# Patient Record
Sex: Male | Born: 1975 | Race: White | Hispanic: No | Marital: Married
Health system: Midwestern US, Community
[De-identification: ages and names within clinical notes are randomized; demographics above are authoritative.]

## PROBLEM LIST (undated history)

## (undated) DIAGNOSIS — IMO0002 Reserved for concepts with insufficient information to code with codable children: Secondary | ICD-10-CM

## (undated) DIAGNOSIS — I2699 Other pulmonary embolism without acute cor pulmonale: Secondary | ICD-10-CM

## (undated) DIAGNOSIS — Z131 Encounter for screening for diabetes mellitus: Secondary | ICD-10-CM

## (undated) DIAGNOSIS — I10 Essential (primary) hypertension: Principal | ICD-10-CM

## (undated) HISTORY — PX: APPENDECTOMY: SHX54

## (undated) HISTORY — PX: CERVICAL FUSION: SHX112

## (undated) HISTORY — PX: OTHER SURGICAL HISTORY: SHX169

---

## 1997-06-01 ENCOUNTER — Emergency Department (HOSPITAL_COMMUNITY): Admission: EM | Admit: 1997-06-01 | Discharge: 1997-06-01 | Payer: Self-pay | Admitting: *Deleted

## 2002-07-03 ENCOUNTER — Emergency Department (HOSPITAL_COMMUNITY): Admission: EM | Admit: 2002-07-03 | Discharge: 2002-07-03 | Payer: Self-pay

## 2002-07-03 ENCOUNTER — Encounter: Payer: Self-pay | Admitting: Hospitalist

## 2003-02-14 ENCOUNTER — Encounter: Admission: RE | Admit: 2003-02-14 | Discharge: 2003-02-14 | Payer: Self-pay | Admitting: Family Medicine

## 2003-10-21 ENCOUNTER — Encounter (INDEPENDENT_AMBULATORY_CARE_PROVIDER_SITE_OTHER): Payer: Self-pay | Admitting: Specialist

## 2003-10-21 ENCOUNTER — Inpatient Hospital Stay (HOSPITAL_COMMUNITY): Admission: EM | Admit: 2003-10-21 | Discharge: 2003-10-22 | Payer: Self-pay | Admitting: *Deleted

## 2004-02-02 ENCOUNTER — Ambulatory Visit: Payer: Self-pay | Admitting: Internal Medicine

## 2005-06-15 ENCOUNTER — Inpatient Hospital Stay (HOSPITAL_COMMUNITY): Admission: EM | Admit: 2005-06-15 | Discharge: 2005-06-17 | Payer: Self-pay | Admitting: Emergency Medicine

## 2006-03-17 ENCOUNTER — Ambulatory Visit (HOSPITAL_COMMUNITY): Admission: RE | Admit: 2006-03-17 | Discharge: 2006-03-17 | Payer: Self-pay | Admitting: Specialist

## 2006-03-24 ENCOUNTER — Ambulatory Visit: Payer: Self-pay | Admitting: Oncology

## 2006-04-07 LAB — CBC WITH DIFFERENTIAL/PLATELET
Basophils Absolute: 0.1 10*3/uL (ref 0.0–0.1)
EOS%: 5.4 % (ref 0.0–7.0)
HCT: 43.6 % (ref 38.7–49.9)
HGB: 15.3 g/dL (ref 13.0–17.1)
LYMPH%: 31.6 % (ref 14.0–48.0)
MCH: 30.6 pg (ref 28.0–33.4)
MCHC: 35.1 g/dL (ref 32.0–35.9)
MCV: 87.1 fL (ref 81.6–98.0)
MONO%: 7.8 % (ref 0.0–13.0)
NEUT%: 53.8 % (ref 40.0–75.0)
Platelets: 312 10*3/uL (ref 145–400)
lymph#: 2.8 10*3/uL (ref 0.9–3.3)

## 2006-04-07 LAB — MORPHOLOGY: PLT EST: ADEQUATE

## 2006-04-11 ENCOUNTER — Ambulatory Visit (HOSPITAL_COMMUNITY): Admission: RE | Admit: 2006-04-11 | Discharge: 2006-04-12 | Payer: Self-pay | Admitting: Neurosurgery

## 2006-04-12 LAB — COMPREHENSIVE METABOLIC PANEL
Albumin: 4.7 g/dL (ref 3.5–5.2)
BUN: 14 mg/dL (ref 6–23)
CO2: 22 mEq/L (ref 19–32)
Glucose, Bld: 115 mg/dL — ABNORMAL HIGH (ref 70–99)
Sodium: 134 mEq/L — ABNORMAL LOW (ref 135–145)
Total Bilirubin: 0.4 mg/dL (ref 0.3–1.2)
Total Protein: 7.7 g/dL (ref 6.0–8.3)

## 2006-04-12 LAB — LUPUS ANTICOAGULANT PANEL: PTT Lupus Anticoagulant: 45.4 secs (ref 36.3–48.8)

## 2006-04-12 LAB — BETA-2 GLYCOPROTEIN ANTIBODIES: Beta-2-Glycoprotein I IgM: <4 U/mL (ref <10)

## 2006-04-12 LAB — PROTHROMBIN GENE MUTATION

## 2006-04-12 LAB — CARDIOLIPIN ANTIBODIES, IGG, IGM, IGA: Anticardiolipin IgG: <7 [GPL'U] (ref <11)

## 2006-04-12 LAB — FACTOR 5 LEIDEN

## 2006-04-12 LAB — LACTATE DEHYDROGENASE: LDH: 142 U/L (ref 94–250)

## 2006-05-04 ENCOUNTER — Ambulatory Visit: Payer: Self-pay | Admitting: Oncology

## 2006-10-12 ENCOUNTER — Encounter: Admission: RE | Admit: 2006-10-12 | Discharge: 2006-10-12 | Payer: Self-pay | Admitting: Neurosurgery

## 2007-05-31 ENCOUNTER — Ambulatory Visit: Payer: Self-pay | Admitting: Family Medicine

## 2007-11-01 ENCOUNTER — Encounter: Admission: RE | Admit: 2007-11-01 | Discharge: 2007-11-01 | Payer: Self-pay | Admitting: Neurosurgery

## 2007-11-29 ENCOUNTER — Encounter: Admission: RE | Admit: 2007-11-29 | Discharge: 2007-11-29 | Payer: Self-pay | Admitting: Neurosurgery

## 2008-06-03 ENCOUNTER — Ambulatory Visit: Payer: Self-pay | Admitting: Family Medicine

## 2008-06-03 LAB — CONVERTED CEMR LAB
Bilirubin Urine: NEGATIVE
Blood in Urine, dipstick: NEGATIVE
Epithelial cells, urine: 0 /lpf
Glucose, Urine, Semiquant: NEGATIVE
Ketones, urine, test strip: NEGATIVE
Protein, U semiquant: NEGATIVE
RBC / HPF: 0
Specific Gravity, Urine: 1.015
WBC, UA: 0 cells/hpf
pH: 7.5

## 2008-09-25 ENCOUNTER — Encounter: Admission: RE | Admit: 2008-09-25 | Discharge: 2008-09-25 | Payer: Self-pay | Admitting: Neurosurgery

## 2009-01-07 ENCOUNTER — Ambulatory Visit: Payer: Self-pay | Admitting: Family Medicine

## 2009-01-07 LAB — CONVERTED CEMR LAB: Rapid Strep: POSITIVE

## 2009-03-10 ENCOUNTER — Encounter: Admission: RE | Admit: 2009-03-10 | Discharge: 2009-03-10 | Payer: Self-pay | Admitting: Neurosurgery

## 2009-08-28 ENCOUNTER — Encounter: Admission: RE | Admit: 2009-08-28 | Discharge: 2009-08-28 | Payer: Self-pay | Admitting: Neurosurgery

## 2009-12-07 ENCOUNTER — Ambulatory Visit: Payer: Self-pay | Admitting: Internal Medicine

## 2010-01-13 ENCOUNTER — Ambulatory Visit: Payer: Self-pay | Admitting: Internal Medicine

## 2010-01-13 ENCOUNTER — Encounter: Payer: Self-pay | Admitting: Internal Medicine

## 2010-01-13 DIAGNOSIS — F17200 Nicotine dependence, unspecified, uncomplicated: Secondary | ICD-10-CM | POA: Insufficient documentation

## 2010-01-13 DIAGNOSIS — D179 Benign lipomatous neoplasm, unspecified: Secondary | ICD-10-CM | POA: Insufficient documentation

## 2010-01-13 DIAGNOSIS — M51379 Other intervertebral disc degeneration, lumbosacral region without mention of lumbar back pain or lower extremity pain: Secondary | ICD-10-CM | POA: Insufficient documentation

## 2010-01-13 DIAGNOSIS — M5412 Radiculopathy, cervical region: Secondary | ICD-10-CM | POA: Insufficient documentation

## 2010-01-13 DIAGNOSIS — J069 Acute upper respiratory infection, unspecified: Secondary | ICD-10-CM | POA: Insufficient documentation

## 2010-01-13 DIAGNOSIS — M5137 Other intervertebral disc degeneration, lumbosacral region: Secondary | ICD-10-CM

## 2010-01-15 LAB — CONVERTED CEMR LAB
ALT: 23 units/L (ref 0–53)
AST: 21 units/L (ref 0–37)
Albumin: 4.8 g/dL (ref 3.5–5.2)
Alkaline Phosphatase: 40 units/L (ref 39–117)
BUN: 10 mg/dL (ref 6–23)
Basophils Absolute: 0 10*3/uL (ref 0.0–0.1)
Basophils Relative: 0.4 % (ref 0.0–3.0)
Bilirubin, Direct: 0.1 mg/dL (ref 0.0–0.3)
CO2: 29 meq/L (ref 19–32)
Calcium: 9.9 mg/dL (ref 8.4–10.5)
Chloride: 102 meq/L (ref 96–112)
Cholesterol: 239 mg/dL — ABNORMAL HIGH (ref 0–200)
Creatinine, Ser: 0.9 mg/dL (ref 0.4–1.5)
Direct LDL: 164.2 mg/dL
Eosinophils Absolute: 0.5 10*3/uL (ref 0.0–0.7)
Eosinophils Relative: 4.3 % (ref 0.0–5.0)
GFR calc non Af Amer: 107.31 mL/min (ref >60.00)
Glucose, Bld: 81 mg/dL (ref 70–99)
HCT: 44.2 % (ref 39.0–52.0)
HDL: 35.4 mg/dL — ABNORMAL LOW (ref >39.00)
Hemoglobin: 15 g/dL (ref 13.0–17.0)
Lymphocytes Relative: 22.9 % (ref 12.0–46.0)
Lymphs Abs: 2.7 10*3/uL (ref 0.7–4.0)
MCHC: 33.9 g/dL (ref 30.0–36.0)
MCV: 91.3 fL (ref 78.0–100.0)
Monocytes Absolute: 0.8 10*3/uL (ref 0.1–1.0)
Monocytes Relative: 7.1 % (ref 3.0–12.0)
Neutro Abs: 7.7 10*3/uL (ref 1.4–7.7)
Neutrophils Relative %: 65.3 % (ref 43.0–77.0)
PSA: 0.53 ng/mL (ref 0.10–4.00)
Platelets: 290 10*3/uL (ref 150.0–400.0)
Potassium: 5.2 meq/L — ABNORMAL HIGH (ref 3.5–5.1)
RBC: 4.83 M/uL (ref 4.22–5.81)
RDW: 12.6 % (ref 11.5–14.6)
Sodium: 139 meq/L (ref 135–145)
TSH: 0.67 microintl units/mL (ref 0.35–5.50)
Total Bilirubin: 0.9 mg/dL (ref 0.3–1.2)
Total CHOL/HDL Ratio: 7
Total Protein: 7.8 g/dL (ref 6.0–8.3)
Triglycerides: 256 mg/dL — ABNORMAL HIGH (ref 0.0–149.0)
VLDL: 51.2 mg/dL — ABNORMAL HIGH (ref 0.0–40.0)
WBC: 11.8 10*3/uL — ABNORMAL HIGH (ref 4.5–10.5)

## 2010-02-13 ENCOUNTER — Encounter: Payer: Self-pay | Admitting: Family Medicine

## 2010-02-23 NOTE — Assessment & Plan Note (Signed)
Summary: STREP THROAT/DLO   Vital Signs:  Patient profile:   35 year old male Height:      77 inches Weight:      235 pounds BMI:     27.97 Temp:     97.8 degrees F oral Pulse rate:   88 / minute Pulse rhythm:   regular BP sitting:   120 / 84  (left arm) Cuff size:   large  Vitals Entered By: Selena Batten Dance CMA Duncan Dull) (December 07, 2009 2:14 PM) CC: ? Strep Comments Sore throat x3 days, no energy, no appetite   History of Present Illness: CC: ST  4d h/o ST, body aches, fatigue.  tried gargling salt water, lemon juice, chloraseptic spray.  Also with fever to 101 last night.  + chills.  + HA this am.    No cough, abd pain, n/v, rashes.  No sick contacts at home.  + daughters sick 1 1/2 wks ago with AOM.  + had flu shot.  + pt and wife smoke, 3-4/day.    Current Medications (verified): 1)  Oxycodone-Acetaminophen 10-325 Mg Tabs (Oxycodone-Acetaminophen) .... As Needed  Allergies: 1)  ! Codeine PMH-FH-SH reviewed for relevance  Social History: mild smoker (3-4 cig/day)  Review of Systems       per HPI  Physical Exam  General:  alert, well-developed, well-nourished, and well-hydrated.   Head:  Normocephalic and atraumatic without obvious abnormalities. No apparent alopecia or balding. Eyes:  No corneal or conjunctival inflammation noted. EOMI. Perrla.  Ears:  R ear normal and L ear normal.   Nose:  no airflow obstruction, mucosal erythema, and mucosal edema.  Mouth:  no exudates, + pharyngeal erythema Neck:  + R AC LAD Lungs:  Normal respiratory effort, chest expands symmetrically. Lungs are clear to auscultation, no crackles or wheezes. Heart:  normal rate, regular rhythm, and no murmur.   Abdomen:  abdomen soft and non-tender without masses, organomegaly or hernias noted. Pulses:  2+ rad pulses Extremities:  no edema Skin:  turgor normal, color normal, and no rashes.     Impression & Recommendations:  Problem # 1:  PHARYNGITIS, STREPTOCOCCAL, ACUTE  (ICD-034.0)  rapid strep positive.  take abx for 10 days.  red flags to return discussed  The following medications were removed from the medication list:    Amoxicillin 500 Mg Caps (Amoxicillin) .Marland Kitchen... Take 2 caps two times a day x 10days His updated medication list for this problem includes:    Amoxicillin 875 Mg Tabs (Amoxicillin) .Marland Kitchen... Take one twice daily x 10 days    Ibuprofen 600 Mg Tabs (Ibuprofen) .Marland Kitchen... Take one by mouth three times a day as needed sore throat with food  Orders: Rapid Strep (13086)  Complete Medication List: 1)  Oxycodone-acetaminophen 10-325 Mg Tabs (Oxycodone-acetaminophen) .... As needed 2)  Amoxicillin 875 Mg Tabs (Amoxicillin) .... Take one twice daily x 10 days 3)  Ibuprofen 600 Mg Tabs (Ibuprofen) .... Take one by mouth three times a day as needed sore throat with food  Patient Instructions: 1)  Looks like strep throat. 2)  Wash hands to prevent spreading. 3)  Push fluids, get plenty of rest, ibuprofen (motrin) for sore throat.  Suck on cold things like popsicles, or heat to soothe throat like herbal teas, salt water gargles. 4)  If fever >101.5, trouble breathing or opening mouth, drooling, or other concerns, you may need to return to be seen. 5)  Pleasure to see you today, call clinic with questions.  Prescriptions: IBUPROFEN 600 MG  TABS (IBUPROFEN) take one by mouth three times a day as needed sore throat with food  #30 x 0   Entered and Authorized by:   Eustaquio Boyden  MD   Signed by:   Eustaquio Boyden  MD on 12/07/2009   Method used:   [facility]        [facility]ret[facility]       [address]       [place], [place]  [address]       Ph: [phone] or [phone]       Fax: [phone]   RxID:   [id number] AMOXICILLIN 875 MG TABS (AMOXICILLIN) take one twice daily x 10 days  #20 x 0   Entered and Authorized by:   Eustaquio Boyden  MD   Signed by:   Eustaquio Boyden  MD on 12/07/2009   Method used:    [facility]        [facility] [id number]       [address]       [place], [place]  [address]       Ph: [phone] or [phone]       Fax: [phone]   RxID:   [id number]    Orders Added: 1)  Est. Patient Level III [07371] 2)  Rapid Strep [06269]    Prior Medications: Current Allergies (reviewed today): ! CODEINE  Laboratory Results  Date/Time Received: December 07, 2009  Date/Time Reported: December 07, 2009   Other Tests  Rapid Strep: positive

## 2010-02-25 NOTE — Assessment & Plan Note (Signed)
Summary: NEW TO ESTAB//PH   Vital Signs:  Patient profile:   35 year old male Height:      77.5 inches Weight:      237.2 pounds BMI:     27.87 Temp:     98.5 degrees F oral Pulse rate:   72 / minute Resp:     14 per minute BP sitting:   128 / 86  (left arm) Cuff size:   large  Vitals Entered By: Shonna Chock CMA (January 13, 2010 11:24 AM) CC: New establish: CPX and examine knot on back ( x 1 year and when touched causes pain in right arm and neck area), URI symptoms   Primary Care Provider:  Kerby Nora MD  CC:  New establish: CPX and examine knot on back ( x 1 year and when touched causes pain in right arm and neck area) and URI symptoms.  History of Present Illness:    [name] is here for a physical; he has had minor URI symptoms for 3 days. The patient reports nasal congestion and purulent ( ? green) nasal discharge, but denies productive cough and earache.  The patient denies fever, dyspnea, and wheezing.  The patient denies headache.  The patient denies the following risk factors for Strep sinusitis: unilateral facial pain, tooth pain, and tender adenopathy.  Rx: Mucinex.  Preventive Screening-Counseling & Management  Caffeine-Diet-Exercise     Does Patient Exercise: no  Current Medications (verified): 1)  Oxycodone-Acetaminophen 10-325 Mg Tabs (Oxycodone-Acetaminophen) .... As Needed  Allergies: 1)  ! 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  The patient denies anorexia, weight loss, weight gain, vision loss, decreased hearing, hoarseness, chest pain, syncope, peripheral edema, prolonged cough, headaches, hemoptysis, abdominal pain, melena, hematochezia, severe indigestion/heartburn, suspicious skin lesions, depression, unusual weight change, abnormal bleeding, enlarged lymph nodes, and angioedema.          Right C-8 radiculopathy symptoms when Lipoma R upper back is palpated X 3  days .Tremor RUE with lifting motion  X 6 months. GU:  Denies discharge, dysuria, and hematuria.  Physical Exam  General:  well-nourished,in no acute distress; alert,appropriate and cooperative throughout examination Head:  Normocephalic and atraumatic without obvious abnormalities. No apparent alopecia . Eyes:  No corneal or conjunctival inflammation noted.Perrla. Funduscopic exam benign, without hemorrhages, exudates or papilledema.  Ears:  External ear exam shows no significant lesions or deformities.  Otoscopic examination reveals clear canals, tympanic membranes are intact bilaterally without bulging, retraction, inflammation or discharge. Hearing is grossly normal bilaterally. Nose:  External nasal examination shows no deformity or inflammation. Nasal mucosa  dry without lesions or exudates.Septal deviation to R Mouth:  Oral mucosa and oropharynx without lesions or exudates.  Teeth in good repair. Mid pharyngeal erythema.   Neck:  No deformities, masses, or tenderness noted.Thyroid full w/o nodules  Lungs:  Normal respiratory effort, chest expands symmetrically. Lungs are clear to auscultation, no crackles or  wheezes. Heart:  Normal rate and regular rhythm. S1 and S2 normal without gallop, murmur, click, rub.S4 Abdomen:  Bowel sounds positive,abdomen soft and non-tender without masses, organomegaly or hernias noted. Rectal:  No external abnormalities noted. Normal sphincter  tone. No rectal masses or tenderness. Genitalia:  Testes bilaterally descended without nodularity, tenderness or masses. No scrotal masses or lesions. No penis lesions or urethral discharge. L varicocele. Tiny granuloma R testicle  Prostate:  Prostate gland firm and smooth, no enlargement, nodularity, tenderness, mass, asymmetry or induration. Msk:  Pain LS area with SLR   15   degrees Pulses:  R and L carotid,radial,dorsalis pedis and posterior tibial pulses are full and equal bilaterally Extremities:  No clubbing, cyanosis, edema.  Neurologic:  alert & oriented X3, strength normal in all extremities, and DTRs symmetrical and normal.   ? weakness of thumb to 5th digits bilaterally Skin:  Lipoma R upper back ; C 8 pain radiation with palpation Cervical Nodes:  No lymphadenopathy noted Axillary Nodes:  No palpable lymphadenopathy Inguinal Nodes:  No significant adenopathy Psych:  memory intact for recent and remote, normally interactive, and good eye contact.     Impression & Recommendations:  Problem # 1:  ROUTINE GENERAL MEDICAL EXAM@HEALTH  CARE FACL (ICD-V70.0)  Orders: EKG w/ Interpretation (93000) Venipuncture (47425) TLB-Lipid Panel (80061-LIPID) TLB-BMP (Basic Metabolic Panel-BMET) (80048-METABOL) TLB-CBC Platelet - w/Differential (85025-CBCD) TLB-Hepatic/Liver Function Pnl (80076-HEPATIC) TLB-TSH (Thyroid Stimulating Hormone) (84443-TSH) TLB-PSA (Prostate Specific Antigen) (84153-PSA)  Problem # 2:  URI (ICD-465.9)  The following medications were removed from the medication list:    Ibuprofen 600 Mg Tabs (Ibuprofen) .Marland Kitchen... 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 As needed 2)  Amoxicillin 500 Mg Caps (Amoxicillin) .Marland Kitchen.. 1 three times a day  Patient Instructions: 1)  Consider exercises involving stretching as discussed. See Dr Channing Mutters if radicular RUE pain progresses. Prescriptions: AMOXICILLIN 500 MG CAPS (AMOXICILLIN) 1 three times a day  #30 x 0   Entered and Authorized by:   Marga Melnick MD   Signed by:   Marga Melnick MD on 01/13/2010   Method used:   [facility]ed to ...       [id number]       [address]       [place], [place]  [address]       Ph: [phone] or [phone]       Fax: [phone]   RxID:   [id number]    Orders Added: 1)  New Patient 18-39 years [99385] 2)  EKG w/ Interpretation [93000] 3)  Venipuncture [36415] 4)  TLB-Lipid Panel [80061-LIPID] 5)  TLB-BMP (Basic Metabolic Panel-BMET) [80048-METABOL] 6)  TLB-CBC Platelet - w/Differential [85025-CBCD] 7)  TLB-Hepatic/Liver Function Pnl [80076-HEPATIC] 8)  TLB-TSH (Thyroid Stimulating Hormone) [84443-TSH] 9)  TLB-PSA (Prostate Specific Antigen) [15176-HYW]

## 2010-05-05 ENCOUNTER — Other Ambulatory Visit: Payer: Self-pay | Admitting: Neurosurgery

## 2010-05-05 DIAGNOSIS — M4716 Other spondylosis with myelopathy, lumbar region: Secondary | ICD-10-CM

## 2010-05-25 ENCOUNTER — Ambulatory Visit
Admission: RE | Admit: 2010-05-25 | Discharge: 2010-05-25 | Disposition: A | Discharge status: Home / Self Care | Payer: PRIVATE HEALTH INSURANCE | Source: Ambulatory Visit | Attending: Neurosurgery | Admitting: Neurosurgery

## 2010-05-25 DIAGNOSIS — M4716 Other spondylosis with myelopathy, lumbar region: Secondary | ICD-10-CM

## 2010-06-11 NOTE — Op Note (Signed)
NAME:  Shawn [name], Shawn [name]               ACCOUNT NO.:  [id number]   MEDICAL RECORD NO.:  [id number]          PATIENT TYPE:  INP   LOCATION:  2550                         FACILITY:  [facility]   PHYSICIAN:  Gabrielle Dare. [name], M.D.DATE OF BIRTH:  1975-05-22   DATE OF PROCEDURE:  10/21/2003  DATE OF DISCHARGE:                                 OPERATIVE REPORT   PREOPERATIVE DIAGNOSIS:  Acute appendicitis.   POSTOPERATIVE DIAGNOSIS:  Acute appendicitis.   PROCEDURE:  Laparoscopic appendectomy.   SURGEON:  Gabrielle Dare. [name], M.D.   ANESTHESIA:  General.   HISTORY OF PRESENT ILLNESS:  The patient is a 35 year old male who presented  to the emergency department with abdominal pain.  History, physical, and  workup was consistent with acute appendicitis, and he is brought to the  operating room for emergency appendectomy.   PROCEDURE IN DETAIL:  Informed consent was obtained.  The patient received  intravenous antibiotics preoperatively in the emergency room.  He was  brought to the operating room, general anesthesia was administered, his  abdomen was prepped and draped in a sterile fashion.  An infraumbilical  incision was made, subcutaneous tissues were dissected down revealing the  anterior fascia, which was divided sharply.  The peritoneal cavity was then  entered under direct vision without difficulty.  A 0 Vicryl pursestring  suture was placed around the fascial opening.  The Hasson trocar was  inserted into the abdomen and the abdomen was insufflated with carbon  dioxide in the standard fashion.  Under direct vision a 12 mm left lower  quadrant port and a 5 mm right upper quadrant port were placed.  Exploration  revealed a long, inflamed appendix with a normal base.  The base was  dissected from the mesoappendix bluntly and the base of the appendix was  then divided with an endoscopic GIA stapler with vascular load.  Subsequently the mesoappendix was divided with two firings of the  endoscopic  GIA stapler with the vascular load.  Excellent hemostasis was obtained.  The  appendix was placed in an EndoCatch bag and removed via the left lower  quadrant port site.  Subsequently the abdomen was copiously irrigated.  The  staple lines were rechecked and hemostasis was noted.  Two liters of  irrigation were used.  The appendix was not perforated.  The irrigant  returned clear.  The staple lines were reinspected and excellent hemostasis  was noted.  At this time the ports were removed under direct vision and the  pneumoperitoneum was released.  The Hasson trocar was removed and the  infraumbilical fascial defect was closed by tying the pursestring Vicryl  suture.  The wounds were all copiously irrigated.  Marcaine 0.25% with  epinephrine had been injected after insertion and some further local was  injected at this time.  After  irrigating the wounds, the skin of each was closed with running 4-0 Vicryl  subcuticular stitch.  Sponge, needle, and instrument counts were correct.  Benzoin and Steri-Strips and sterile dressings were applied.  The patient  tolerated the procedure without apparent complications, was taken to the  recovery room in stable condition.       BET/MEDQ  D:  10/21/2003  T:  10/21/2003  Job:  [id number]

## 2010-06-11 NOTE — Discharge Summary (Signed)
Shawn [name], [name]               ACCOUNT NO.:  [id number]   MEDICAL RECORD NO.:  [id number]          PATIENT TYPE:  INP   LOCATION:  3731                         FACILITY:  [facility]   PHYSICIAN:  Hollice Espy, M.D.DATE OF BIRTH:  November 01, 1975   DATE OF ADMISSION:  06/15/2005  DATE OF DISCHARGE:  06/17/2005                                 DISCHARGE SUMMARY   PRIMARY CARE PHYSICIAN:  Stacie Acres. White, M.D.   DISCHARGE DIAGNOSES:  1.  Bilateral pulmonary emboli.  2.  Recent right shoulder arthroscopy.   DISCHARGE MEDICATIONS:  1.  Lovenox 100 mg subcu q.12h. x7 days.  2.  Coumadin 5 mg p.o. nightly.  Lovenox will be continued until the      patient's Coumadin is therapeutic.  3.  Percocet 5/325 p.o. q.6h. p.r.n., #30.  4.  Colace 100 mg p.o. b.i.d.  5.  Laxative p.r.n.   HOSPITAL COURSE:  The patient is a 35 year old, white male with past medical  history of recent right shoulder arthroscopy who for the past 5 days prior  to admission has been fatigued secondary to the arthroscopy and has been  mostly laying on his cough with minimal activity.  On the day of admission,  he started having problems with severe chest pain and shortness of breath  and came into the emergency room.  A CT scan of the chest was checked and he  was found to have bilateral pulmonary emboli.  The patient was admitted and  started on IV heparin and Coumadin.  By hospital day #2, the patient was  starting to feel a little bit better.  He still had some problems with pain,  but his breathing had significantly improved.  We discussed his course given  his otherwise general good health and the plans for long-term  anticoagulation.  We discussed the possibility of him going home on Lovenox  as well as dosing Coumadin with home health checking daily PT/INR and  calling these labs in.  He was interested in this option and this was set up  for May 24.  The patient will be paying a $50 co-pay for 7 days worth of  Lovenox.  The patient himself has been doing well.  He watched the Coumadin  instructional videos and we discussed extensively the risk of contact sports  and injury that could lead to falls and bleeding which he understood.  His  wife and mother were present for separate conservations and he they all show  complete understanding of this.   DISPOSITION:  The plan will be for the patient to go home on Jun 17, 2005,  with Lovenox 100 mg subcu b.i.d.  He has already received instructions and  teaching on how to self-administer the Lovenox or to have his family  administer it.  He has already received Coumadin with 10 mg on the night of  May 23, and May 24, and will now be on 5 mg p.o. nightly.  His INR is still  at baseline at 1.  The plan will be for the patient to have his daily PT/INR  checked.  Over the [facility] Day weekend, they will check this lab and this  will be called in to me, Dr. Hollice Espy.  The results following  completion of Memorial Day weekend of the PT/INRs, will be called into Dr.  Laurann Montana, his PCP.  The patient has since moved from [place] to  [place] and is interested in changing his PCP.  However, until this  established at the full follow-up appointment, PT/INR and appointments will  be made with Dr. Laurann Montana.  In the meantime, I have provided the  patient with my cell phone number as well as the answering phone number for  any problems or issues.   CONDITION ON DISCHARGE:  Improved.   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   DIET:  Regular, except to minimize leafy green vegetables which can affect  his Coumadin levels.      Hollice Espy, M.D.  Electronically Signed     SKK/MEDQ  D:  06/17/2005  T:  06/18/2005  Job:  [id number]   cc:   Stacie Acres. Cliffton Asters, M.D.  Fax: [phone]

## 2010-06-11 NOTE — H&P (Signed)
NAME:  Shawn [name], OVERBY               ACCOUNT NO.:  [id number]   MEDICAL RECORD NO.:  [id number]          PATIENT TYPE:  AMB   LOCATION:  SDS                          FACILITY:  [facility]   PHYSICIAN:  Payton Doughty, M.D.      DATE OF BIRTH:  07/08/1975   DATE OF ADMISSION:  04/11/2006  DATE OF DISCHARGE:                              HISTORY & PHYSICAL   ADMITTED TO [facility]   ADMISSION DIAGNOSIS:  Herniated disk C5-6.   HISTORY OF PRESENT ILLNESS:  A 35 year old right-handed white gentleman  had some softball injuries and then pain and numbness in his right  shoulder and arm.  An MRI demonstrated a disk at C5-6.  He presents now  today for an anterior decompression and fusion.   PAST MEDICAL HISTORY:  Remarkable for a DVT that he had after his  shoulder operation.  He said he laid around for about a week and half.  He was on Coumadin, he is now off since December and has no  difficulties.   FAMILY HISTORY:  He has no family history of coagulopathy, nor has he  ever had other difficulties.   PAST SURGICAL HISTORY:  1. Appendectomy in 2005.  2. Labral repair of his shoulder in 2007.   ALLERGIES:  HE GETS A RASH WITH CODEINE.   MEDICATIONS:  He is currently using hydrocodone and Lyrica.   FAMILY HISTORY:  Mom is 9, dad is 33 with prostate cancer.   SOCIAL HISTORY:  He does not smoke or drink.  He is a Medical illustrator.  He  talks on the phone a fair amount.  He does credit checks.   REVIEW OF SYSTEMS:  Remarkable for arm weakness, arm pain, neck pain.   PHYSICAL EXAMINATION:  HEENT:  Within normal limits.  NECK:  He has reasonable range of motion of the neck, but reproduces his  arm pain with turning his head.  CHEST:  Clear.  CARDIAC EXAM:  Regular rate and rhythm.  ABDOMEN:  Tender, no hepatosplenomegaly.  EXTREMITIES:  No clubbing or cyanosis.  GU:  Exam is deferred.  Peripheral pulses are good.  NEUROLOGICALLY:  He is awake, alert, and oriented.  Cranial nerves II-  XII intact.  MOTOR EXAM:  Extremities 5/5 strength to the left upper extremity.  Right upper extremity has definite weakness in the deltoid, biceps, and  triceps and a right C6 sensory deficit.  Reflexes are 1 to the right  biceps, absent to the left, 1 at the left triceps, 1 to the right.  Branchial radialis is 1 bilaterally.  Hoffman's is negative.   IMAGING:  An MRI that shows mild to moderate change at 3-4, but it is at  C5-6.  He is sensitive to the right side with observation on the right  side of the cord and the right C6 nerve root.   CLINICAL IMPRESSION:  1. Right C6 and C7 radiculopathy.  2. 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   PLAN:  For an anterior decompression effusion at C5-6.  The risks and  benefits of this approach had been discussed and he wishes  to proceed.           ______________________________  Payton Doughty, M.D.     MWR/MEDQ  D:  04/11/2006  T:  04/11/2006  Job:  [id number]

## 2010-06-11 NOTE — H&P (Signed)
NAME:  Shawn [name], Shawn [name]               ACCOUNT NO.:  [id number]   MEDICAL RECORD NO.:  [id number]          PATIENT TYPE:  EMS   LOCATION:  MAJO                         FACILITY:  [facility]   PHYSICIAN:  Gabrielle Dare. [name], M.D.DATE OF BIRTH:  Sep 13, 1975   DATE OF ADMISSION:  10/21/2003  DATE OF DISCHARGE:                                HISTORY & PHYSICAL   CHIEF COMPLAINT:  Right lower quadrant abdominal pain.   HISTORY OF PRESENT ILLNESS:  The patient is a 35 year old white male who  complains of abdominal pain which developed while was at work yesterday.  It  was initially generalized and came on around noon time.  Later in the  evening, the pain became more severe and gradually localized in his right  lower quadrant.  Overnight, he came to the emergency room for evaluation.  Workup included white blood cell count which was 15.1.  CT scan of abdomen  and pelvis this morning is consistent with acute appendicitis.  The patient  has no other current complaints.   PAST MEDICAL HISTORY:  Negative.   PAST SURGICAL HISTORY:  Negative.   ALLERGIES:  CODEINE.   SOCIAL HISTORY:  The patient smoke occasionally.  He does not drink alcohol.  He has history of some cocaine abuse in the past.   REVIEW OF SYSTEMS:  GENERAL:  Negative.  CARDIAC:  Negative.  PULMONARY:  Negative.  GI:  See History of Present Illness.  GU:  Negative.  MUSCULOSKELETAL:  Negative.   PHYSICAL EXAMINATION:  VITAL SIGNS:  Temperature 98.5, blood pressure  133/70, heart rate 83, respirations 18.  GENERAL:  He is awake, alert, and in no acute distress.  HEENT:  Pupils equal and reactive.  Sclerae nonicteric.  NECK:  Supple with no thyroid masses.  There is no supraclavicular or  cervical adenopathy.  LUNGS:  Clear to auscultation with normal respiratory excursion.  HEART:  Regular rate and rhythm.  PMI is palpable in the left chest.  ABDOMEN:  Soft.  He has tenderness in the right lower quadrant with  voluntary guarding.   No other tenderness or masses aer noted.  No  organomegaly is palpable.  SKIN:  Warm and dry with no rashes.  EXTREMITIES:  No significant edema.   DATA REVIEWED:  White blood cell count 15.1, hemoglobin 15.6, platelets 285.  Urinalysis is negative.  A metabolic profile is unremarkable.   IMPRESSION:  The patient is a 35 year old white male, who is otherwise  healthy, with acute appendicitis.   PLAN:  Give him intravenous antibiotics.  He has already received Unasyn by  the emergency department physician, and we will take him to the operating  room for laparoscopic appendectomy.  The procedure, risks, and benefits were  discussed in detail with the patient and his mother, and he is agreeable  with proceeding.  We will do this as soon as possible.       BET/MEDQ  D:  10/21/2003  T:  10/21/2003  Job:  [id number]

## 2010-06-11 NOTE — Op Note (Signed)
NAME:  Shawn [name], Shawn [name] NO.:  [id number]   MEDICAL RECORD NO.:  [id number]          PATIENT TYPE:  OIB   LOCATION:  3172                         FACILITY:  [facility]   PHYSICIAN:  Payton Doughty, M.D.      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   OPERATIVE PROCEDURE:  C5-C6 anterior cervical decompression and fusion  with Reflex hybrid plate.   ANESTHESIA:  General endotracheal anesthesia.   PREPARATION:  Betadine prep and alcohol wipe.   COMPLICATIONS:  None.   SURGEON:  Payton Doughty, M.D.   ASSISTANT:  Clydene Fake, M.D.  Nurse assistant Basilia Jumbo.   BODY OF TEXT:  This is a 35 year old right handed white gentleman with a  right C6 radiculopathy.  He was taken to the operating room, smoothly  anesthetized, and intubated.  He was placed supine on the operating  table in the halter head traction.  Following shave, prep, and drape in  the usual sterile fashion, the skin was incised in the midline at the  medial border of the sternocleidomastoid muscle on the left side.  The  platysma was identified, elevated, divided and undermined.  The  sternocleidomastoid was identified and medial dissection revealed the  carotid artery which was retracted laterally to the left, the trachea  and esophagus retracted laterally to the right, exposing the bones of  the anterior cervical spine.  A marker was placed and intraoperative x-  ray obtained to confirm correctness of the level.  Having confirmed the  correctness of the level, discectomy was carried out at C5-C6 under  gross observation.  We then brought the operating microscope in and used  microdissection technique to remove the remaining disc, divide the  posterior annular fibers, divide the posterior  longitudinal ligament,  and carefully explore the neural foramina bilaterally.  On the right  side, there was a large free fragment of disc extending out into the  foramen.  It was grasped and removed without difficulty.  The neural  foramen was carefully explored and found to be open.  The left side was  unencumbered.  The wound was irrigated, hemostasis was assured and an 8  mm bone graft was fashioned from a patellar allograft and tapped into  place.  Reflex hybrid plate was then placed, 14 mm long with 12 mm  screws, two in C5 and two in C6.  Intraoperative x-ray showed good  placement of bone graft, plate and screws.  The wound was, once again,  irrigated, hemostasis assured.  The esophagus was inspected and found to  be free of lesions.  The platysma and subcutaneous tissues were  approximated with 3-0 Vicryl in an interrupted  fashion.  The skin was closed with 4-0 Vicryl in a running subcuticular  fashion.  Benzoin and Steri-Strips were placed and made occlusive with  Telfa and OpSite.  The patient was placed  in an Aspen collar and  returned to the recovery room in good condition.           ______________________________  Payton Doughty, M.D.     MWR/MEDQ  D:  04/11/2006  T:  04/11/2006  Job:  [id number]

## 2010-06-11 NOTE — Discharge Summary (Signed)
NAME:  Shawn [name], Shawn [name]               ACCOUNT NO.:  [id number]   MEDICAL RECORD NO.:  [id number]          PATIENT TYPE:  INP   LOCATION:  5707                         FACILITY:  [facility]   PHYSICIAN:  Gabrielle Dare. [name], M.D.DATE OF BIRTH:  Dec 29, 1975   DATE OF ADMISSION:  10/21/2003  DATE OF DISCHARGE:  10/22/2003                                 DISCHARGE SUMMARY   DISCHARGE DIAGNOSES:  1.  Acute appendicitis.  2.  Status post laparoscopic appendectomy.  3.  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   HISTORY OF PRESENT ILLNESS:  The patient is a 35 year old white male who  presented with signs and symptoms of acute appendicitis.  This was confirmed  on CT scan.  I was consulted and admitted the patient and took him  emergently to the operating room.   HOSPITAL COURSE:  The patient received antibiotics and underwent  laparoscopic appendectomy.  This was uncomplicated.  His appendix was  inflamed but not perforated.  Postoperatively, initially we tried some  Ultram p.o. and some morphine for his pain.  The Ultram did not help him, so  this was changed to Percocet with excellent relief of his pain.  His  abdominal pain was significantly better postoperative day #1, and he was  tolerating p.o.  He did note some swelling and tenderness around his right  testicle, and on exam, this was consistent with a likely communicating  hydrocele with some peritoneal irrigation fluid from his surgery yesterday.  Not uncommon to gather there in patients with communicating hydrocele after  laparoscopy.  I offered the patient urology consult, but he refuses this and  claims he wants to go home.  He does promise to call if he develops any  worsening of his pain or swelling in that area.  Otherwise, he remained  afebrile and hemodynamically stable.  His postoperative course was  uneventful.   CONDITION ON DISCHARGE:  Stable.   DISCHARGE INSTRUCTIONS:  1.  Diet:  Regular.  2.  Activity:  No lifting and  scrotal support for comfort.   DISCHARGE MEDICATIONS:  Percocet 5/325 1-2 p.o. q.6h. p.r.n. pain.   FOLLOWUP:  Follow up is in 3 weeks with myself.       BET/MEDQ  D:  10/22/2003  T:  10/22/2003  Job:  [id number]

## 2010-06-11 NOTE — H&P (Signed)
NAME:  TARVARIS, PUGLIA               ACCOUNT NO.:  [id number]   MEDICAL RECORD NO.:  [id number]          PATIENT TYPE:  EMS   LOCATION:  MAJO                         FACILITY:  [facility]   PHYSICIAN:  Corinna L. Lendell Caprice, MDDATE OF BIRTH:  07-07-1975   DATE OF ADMISSION:  06/15/2005  DATE OF DISCHARGE:                                HISTORY & PHYSICAL   CHIEF COMPLAINT:  Chest pain.   HISTORY OF PRESENT ILLNESS:  Mr. Speegle is unassigned 35 year old white male  patient who presents with pleuritic left chest pain that started yesterday.  He also has had shortness of breath and night sweats.  He had right shoulder  arthroscopy several days ago by Dr. Rennis Chris and since then has been fairly  immobile. He has never had blood clots in the past. He is still having quite  a bit of pain despite morphine. He has had no leg pain or swelling.   PAST MEDICAL HISTORY:  Appendectomy.   MEDICATIONS:  1.  Dilaudid.  2.  Methocarbamol.  3.  Milk of Magnesia.  4.  Naprosyn.  5.  Stool softener.  6.  Ambien.   ALLERGIES:  He has allergic to CODEINE which causes a rash.   SOCIAL HISTORY:  He quit smoking 3 months ago.  He is here with his wife.  He is in [employer].  He does not drink or use drugs.   FAMILY HISTORY:  His father has prostate cancer.  Diabetes and hypertension.   REVIEW OF SYSTEMS:  As above, otherwise negative.   PHYSICAL EXAMINATION:  VITAL SIGNS:  His temperature is 97.7.  Blood  pressure 125/68, pulse initially was 116, currently 93, respiratory rate  initially 24 now 15, oxygen saturation 100% on room air.  GENERAL: The patient is well-nourished, well-developed in no acute distress.  HEENT: Normocephalic, atraumatic.  Pupils equal, round, reactive to light.  Sclerae are nonicteric.  Moist mucous membranes.  Neck is supple.  No  lymphadenopathy.  LUNGS: He has rales at the bases.  No wheezes or rhonchi.  CARDIOVASCULAR:  Regular rate and rhythm without murmurs, gallops or rubs.  ABDOMEN:  Normal bowel sounds, soft, nontender, nondistended.  GU and rectal deferred.  EXTREMITIES: No clubbing, cyanosis or edema.  Denna Haggard' sign negative  bilaterally.  SKIN:  No rash.  Psychiatric normal affect.  NEUROLOGIC:  Alert and oriented.  Cranial nerves and sensorimotor exam are  intact.   LABS:  White blood cell count is 16,000, hemoglobin 15.7, hematocrit 45.7,  platelet count 270. Normal differential. BMET unremarkable.   CT of the chest shows bilateral pulmonary emboli and pulmonary infarct  versus atelectasis, tiny left effusion.   ASSESSMENT/PLAN:  1.  Acute bilateral pulmonary emboli:  The patient has already been started      on heparin drip in the emergency room.  I will start Coumadin and the      patient will need to establish a primary care physician.  He has seen      Laurann Montana, M.D. in the past but is undecided about who he will      follow up  with. Apparently he is considering a different doctor who is      closer to where he lives. I will give IV Dilaudid, bed rest for now,      oxygen and incentive spirometry.      Corinna L. Lendell Caprice, MD  Electronically Signed     CLS/MEDQ  D:  06/15/2005  T:  06/15/2005  Job:  [id number]

## 2010-09-13 ENCOUNTER — Other Ambulatory Visit: Payer: Self-pay | Admitting: Orthopaedic Surgery

## 2010-09-13 ENCOUNTER — Ambulatory Visit (HOSPITAL_COMMUNITY)
Admission: RE | Admit: 2010-09-13 | Discharge: 2010-09-13 | Disposition: A | Discharge status: Home / Self Care | Payer: PRIVATE HEALTH INSURANCE | Source: Ambulatory Visit | Attending: Orthopaedic Surgery | Admitting: Orthopaedic Surgery

## 2010-09-13 DIAGNOSIS — M79609 Pain in unspecified limb: Secondary | ICD-10-CM

## 2010-09-13 DIAGNOSIS — R609 Edema, unspecified: Secondary | ICD-10-CM

## 2010-09-13 DIAGNOSIS — R52 Pain, unspecified: Secondary | ICD-10-CM

## 2010-09-13 DIAGNOSIS — M7989 Other specified soft tissue disorders: Secondary | ICD-10-CM | POA: Insufficient documentation

## 2010-11-16 ENCOUNTER — Encounter: Payer: Self-pay | Admitting: Internal Medicine

## 2010-11-18 ENCOUNTER — Ambulatory Visit: Payer: PRIVATE HEALTH INSURANCE | Admitting: Internal Medicine

## 2010-11-18 ENCOUNTER — Ambulatory Visit (INDEPENDENT_AMBULATORY_CARE_PROVIDER_SITE_OTHER): Payer: PRIVATE HEALTH INSURANCE | Admitting: Internal Medicine

## 2010-11-18 ENCOUNTER — Encounter: Payer: Self-pay | Admitting: Internal Medicine

## 2010-11-18 DIAGNOSIS — G4700 Insomnia, unspecified: Secondary | ICD-10-CM

## 2010-11-18 DIAGNOSIS — F419 Anxiety disorder, unspecified: Secondary | ICD-10-CM

## 2010-11-18 DIAGNOSIS — F411 Generalized anxiety disorder: Secondary | ICD-10-CM

## 2010-11-18 MED ORDER — ZOLPIDEM TARTRATE 5 MG PO TABS: 5.0000 mg | ORAL_TABLET | ORAL | Status: DC

## 2010-11-18 MED ORDER — FLUOXETINE HCL 20 MG PO CAPS: 20.0000 mg | ORAL_CAPSULE | Freq: Every day | ORAL | Status: DC

## 2010-11-18 NOTE — Progress Notes (Signed)
Addended byMarga Melnick F on: 11/18/2010 12:37 PM   Modules accepted: Orders

## 2010-11-18 NOTE — Progress Notes (Signed)
  Subjective:    Patient ID: Shawn [name], male    DOB: 02/22/1975, 35 y.o.   MRN: [id number]  HPI    Review of Systems he specifically denies a constellation of headaches, flushing, chest pain, and diarrhea.     Objective:   Physical Exam        Assessment & Plan:

## 2010-11-18 NOTE — Patient Instructions (Signed)
To prevent sleep dysfunction follow these instructions for sleep hygiene. Do not read, watch TV, or eat in bed. Do not get into bed until you are ready to turn off the light &  to go to sleep. Do not ingest stimulants ( decongestants, diet pills, nicotine, caffeine) after the evening meal.  

## 2010-11-18 NOTE — Progress Notes (Signed)
  Subjective:    Patient ID: Shawn [name], male    DOB: 1975-04-01, 35 y.o.   MRN: [id number]  HPI Anxiety Onset:1 month ago in context of work  & financial stresses  Depression:no but increased irritability Loss of interest (Anhedonia):yes, mainly playing with children Panic attacks:no Insomnia:slept  3-4 hrs in past 6 weeks Anorexia:no Fatigue:yes Neurologic signs/symptoms: Headache, numbness and tingling, weakness:no Endocrinologic signs and symptoms: Hoarseness,  vision change, temperature intolerance,  skin/hair,/nail changes:no. Some constipation; weight loss of 10#  with diet & walking Family history of mental health issues, alcoholism or drug abuse:no Medications/efficacy:no   Insomnia pattern: Difficulty going to sleep:yes Frequent awakening:yes Early awakening:@ 6 am Nightmares:yes Abnormal leg movement:no Snoring:no Apnea:no Risk factors/sleep hygiene: Stimulants:no Alcohol intake:no Reading, watching TV, eating in WUJ:WJXBJ or watches tv Daytime naps:no Work/travel factors:no Impact: Daytime hypersomnolence: especially by 2 pm Motor vehicle accident/motor dysfunction:no Treatment to date/efficacy:OTC sleep aids ; these caused hangover           Review of Systems     Objective:   Physical Exam  Gen.: Thin but well-nourished; in no acute distress Eyes: Extraocular motion intact; no lid lag or proptosis Neck: full ROM ; thyroid normal Heart: Normal rhythm and rate without significant murmur, gallop, or extra heart sounds Lungs: Chest clear to auscultation without rales,rales, wheezes Neuro:Deep tendon reflexes are equal and within normal limits; no tremor  Skin: Warm and dry without significant lesions or rashes; no onycholysis Psych: Normally communicative and interactive; no abnormal mood or affect clinically.         Assessment & Plan:  #1 anxiety, situational. 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 See orders

## 2011-02-28 ENCOUNTER — Telehealth: Payer: Self-pay

## 2011-02-28 DIAGNOSIS — G4700 Insomnia, unspecified: Secondary | ICD-10-CM

## 2011-02-28 MED ORDER — ZOLPIDEM TARTRATE 5 MG PO TABS: 5.0000 mg | ORAL_TABLET | ORAL | Status: DC

## 2011-02-28 NOTE — Telephone Encounter (Signed)
Message left on voicemail: Patient was seen several months ago for anxiety and sleep concerns. Patient is still with symptoms, Prozac was recently refilled for the anxiety, patient would like refill for ambien.  Dr.Hopper please advise (Ambien last filled 11/18/10), last OV 11/18/10

## 2011-02-28 NOTE — Telephone Encounter (Signed)
Zolpidem  5 mg one every third night as needed only #10, no refill

## 2011-02-28 NOTE — Telephone Encounter (Signed)
Spoke to pt to advise results/instructions. Pt understood. Pt clarified the pharmacy he is still noted as in the chart Sent pt Zolpidem 5 mg one every third night as needed only #10, no refill

## 2011-03-07 ENCOUNTER — Other Ambulatory Visit: Payer: PRIVATE HEALTH INSURANCE

## 2011-03-07 ENCOUNTER — Telehealth: Payer: Self-pay | Admitting: Internal Medicine

## 2011-03-07 NOTE — Telephone Encounter (Signed)
Patient is coming in tomorrow afternoon for titers for Hep B. Can we get an order for this visit?

## 2011-03-07 NOTE — Telephone Encounter (Signed)
Dr.Hopper please give codes and orders, forward phone note to [employer])

## 2011-03-08 ENCOUNTER — Other Ambulatory Visit: Payer: Self-pay | Admitting: Internal Medicine

## 2011-03-08 ENCOUNTER — Other Ambulatory Visit (INDEPENDENT_AMBULATORY_CARE_PROVIDER_SITE_OTHER): Payer: PRIVATE HEALTH INSURANCE

## 2011-03-08 ENCOUNTER — Ambulatory Visit (INDEPENDENT_AMBULATORY_CARE_PROVIDER_SITE_OTHER): Payer: PRIVATE HEALTH INSURANCE | Admitting: *Deleted

## 2011-03-08 DIAGNOSIS — Z0000 Encounter for general adult medical examination without abnormal findings: Secondary | ICD-10-CM

## 2011-03-08 DIAGNOSIS — Z23 Encounter for immunization: Secondary | ICD-10-CM

## 2011-04-12 ENCOUNTER — Ambulatory Visit (INDEPENDENT_AMBULATORY_CARE_PROVIDER_SITE_OTHER): Payer: PRIVATE HEALTH INSURANCE

## 2011-04-12 DIAGNOSIS — Z23 Encounter for immunization: Secondary | ICD-10-CM

## 2011-04-18 ENCOUNTER — Other Ambulatory Visit: Payer: Self-pay | Admitting: Neurosurgery

## 2011-04-18 DIAGNOSIS — M47812 Spondylosis without myelopathy or radiculopathy, cervical region: Secondary | ICD-10-CM

## 2011-05-02 ENCOUNTER — Ambulatory Visit
Admission: RE | Admit: 2011-05-02 | Discharge: 2011-05-02 | Disposition: A | Discharge status: Home / Self Care | Payer: PRIVATE HEALTH INSURANCE | Source: Ambulatory Visit | Attending: Neurosurgery | Admitting: Neurosurgery

## 2011-05-02 DIAGNOSIS — M47812 Spondylosis without myelopathy or radiculopathy, cervical region: Secondary | ICD-10-CM

## 2011-05-12 ENCOUNTER — Other Ambulatory Visit: Payer: Self-pay | Admitting: Internal Medicine

## 2011-06-27 ENCOUNTER — Telehealth: Payer: Self-pay | Admitting: Internal Medicine

## 2011-06-27 DIAGNOSIS — G4700 Insomnia, unspecified: Secondary | ICD-10-CM

## 2011-06-27 MED ORDER — ZOLPIDEM TARTRATE 5 MG PO TABS: 5.0000 mg | ORAL_TABLET | ORAL | Status: DC

## 2011-06-27 NOTE — Telephone Encounter (Signed)
RX called in, patient's wife informed

## 2011-06-27 NOTE — Telephone Encounter (Signed)
Patient's wife called and states that Dr. Alwyn Ren sometimes prescribes the pt Ambien, but he does not take it on a regular basis. The patient is experiencing stress from work and is having trouble sleeping. Pts wife is requesting that [facility] [place].

## 2011-08-27 ENCOUNTER — Other Ambulatory Visit: Payer: Self-pay | Admitting: Internal Medicine

## 2011-08-30 NOTE — Telephone Encounter (Signed)
RX was called in.

## 2011-12-06 ENCOUNTER — Encounter (HOSPITAL_COMMUNITY): Payer: Self-pay | Admitting: *Deleted

## 2011-12-06 ENCOUNTER — Emergency Department (HOSPITAL_COMMUNITY): Payer: BC Managed Care – PPO

## 2011-12-06 ENCOUNTER — Telehealth: Payer: Self-pay | Admitting: Internal Medicine

## 2011-12-06 ENCOUNTER — Emergency Department (HOSPITAL_COMMUNITY)
Admission: EM | Admit: 2011-12-06 | Discharge: 2011-12-06 | Disposition: A | Discharge status: Home / Self Care | Payer: BC Managed Care – PPO | Attending: Emergency Medicine | Admitting: Emergency Medicine

## 2011-12-06 DIAGNOSIS — R059 Cough, unspecified: Secondary | ICD-10-CM | POA: Insufficient documentation

## 2011-12-06 DIAGNOSIS — Z86711 Personal history of pulmonary embolism: Secondary | ICD-10-CM | POA: Insufficient documentation

## 2011-12-06 DIAGNOSIS — R071 Chest pain on breathing: Secondary | ICD-10-CM | POA: Insufficient documentation

## 2011-12-06 DIAGNOSIS — R0781 Pleurodynia: Secondary | ICD-10-CM

## 2011-12-06 DIAGNOSIS — R05 Cough: Secondary | ICD-10-CM | POA: Insufficient documentation

## 2011-12-06 DIAGNOSIS — F17200 Nicotine dependence, unspecified, uncomplicated: Secondary | ICD-10-CM | POA: Insufficient documentation

## 2011-12-06 HISTORY — DX: Other pulmonary embolism without acute cor pulmonale: I26.99

## 2011-12-06 LAB — COMPREHENSIVE METABOLIC PANEL
ALT: 17 U/L (ref 0–53)
AST: 24 U/L (ref 0–37)
Alkaline Phosphatase: 37 U/L — ABNORMAL LOW (ref 39–117)
CO2: 25 mEq/L (ref 19–32)
Chloride: 100 mEq/L (ref 96–112)
GFR calc non Af Amer: >90 mL/min (ref >90)
Glucose, Bld: 140 mg/dL — ABNORMAL HIGH (ref 70–99)
Sodium: 138 mEq/L (ref 135–145)
Total Bilirubin: 0.5 mg/dL (ref 0.3–1.2)

## 2011-12-06 MED ORDER — IOHEXOL 350 MG/ML SOLN
140.0000 mL | Freq: Once | INTRAVENOUS | Status: AC | PRN
  Administered 2011-12-06: 140 mL via INTRAVENOUS

## 2011-12-06 MED ORDER — NAPROXEN 500 MG PO TABS: 500.0000 mg | ORAL_TABLET | Freq: Two times a day (BID) | ORAL | Status: DC

## 2011-12-06 NOTE — ED Notes (Signed)
Pt has hx of pulmonary embolus after shoulder surgery 4-5 years ago and had another shoulder surgery 9 months ago on right shoulder.  Pt states bent over to dry legs and then started feeling like someone punching lungs in and has pain from under left nipple and around to back.  Hurts to take deep breath.  Pt brother has a clotting disorder where he had a ministroke per patient.

## 2011-12-06 NOTE — ED Notes (Signed)
Pt returned from xray

## 2011-12-06 NOTE — Telephone Encounter (Signed)
He must go to the emergency room to rule out recurrent pulmonary embolus

## 2011-12-06 NOTE — ED Provider Notes (Signed)
History     CSN: [id number]  Arrival date & time 12/06/11  1421   First MD Initiated Contact with Patient 12/06/11 1508      Chief Complaint  Patient presents with  . Chest Pain  . Shortness of Breath    (Consider location/radiation/quality/duration/timing/severity/associated sxs/prior treatment) Patient is a 36 y.o. male presenting with chest pain and shortness of breath. The history is provided by the patient.  Chest Pain Primary symptoms include shortness of breath.    Shortness of Breath  Associated symptoms include chest pain and shortness of breath.  He had onset this morning of sharp left-sided chest pain radiating around to the left lateral chest. Pain is worse with sitting up and worse with taking a deep breath. He denies dyspnea, nausea, vomiting, diaphoresis. He denies fever or chills and denies cough. Pain in is moderate to severe with current pain rated at 7/10, but pain was 10/10 at its worst. Pain is similar to what he had with the pulmonary embolism 5 years ago. Pulmonary medicine followed shoulder surgery, but he has had no recent surgery and denies any long-distance travel. He does have a sibling who was diagnosed with a "sticky blood".  Past Medical History  Diagnosis Date  . Pulmonary emboli     Past Surgical History  Procedure Date  . Torn labrum     RIGHT SHOULDER  . Appendectomy   . Cervical fusion   . Ddd with lumbosacral radiculopathy     Family History  Problem Relation Age of Onset  . Cancer Father     PROSTATE, SKIN  . Hypertension Father   . Stroke Maternal Grandmother     CVA  . Mental illness Maternal Grandfather     ALZHEIMERS  . Diabetes Paternal Grandfather   . Hypertension Paternal Grandfather   . Cancer Paternal Grandfather     PROSTATE    History  Substance Use Topics  . Smoking status: Current Every Day Smoker  . Smokeless tobacco: Not on file     Comment: 4-5 CIGS A DAY  . Alcohol Use: No      Review of Systems    Respiratory: Positive for shortness of breath.   Cardiovascular: Positive for chest pain.  All other systems reviewed and are negative.    Allergies  Codeine  Home Medications   Current Outpatient Rx  Name  Route  Sig  Dispense  Refill  . CYCLOBENZAPRINE HCL 10 MG PO TABS   Oral   Take 10 mg by mouth 3 (three) times daily.         . OXYCODONE-ACETAMINOPHEN 10-325 MG PO TABS   Oral   Take 1 tablet by mouth every 4 (four) hours as needed. For pain           BP 131/114  Pulse 113  Temp 97.4 F (36.3 C) (Oral)  Resp 20  SpO2 97%  Physical Exam  Nursing note and vitals reviewed. 36 year old male, resting comfortably and in no acute distress. Vital signs are significant for hypertension with BP 131/114, and tachycardia with heart rate of 113. Oxygen saturation is 97%, which is normal. Head is normocephalic and atraumatic. PERRLA, EOMI. Oropharynx is clear. Neck is nontender and supple without adenopathy or JVD. Back is nontender and there is no CVA tenderness. Lungs are clear without rales, wheezes, or rhonchi. Chest is mildly tender on the left side of the chest. Heart has regular rate and rhythm without murmur. Abdomen is soft, flat, nontender without  masses or hepatosplenomegaly and peristalsis is normoactive. Extremities have no cyanosis or edema, full range of motion is present. Skin is warm and dry without rash. Neurologic: Mental status is normal, cranial nerves are intact, there are no motor or sensory deficits.   ED Course  Procedures (including critical care time)   Labs Reviewed  POCT I-STAT TROPONIN I  COMPREHENSIVE METABOLIC PANEL  CBC WITH DIFFERENTIAL   Dg Chest 2 View  12/06/2011  *RADIOLOGY REPORT*  Clinical Data: Shortness of breath  CHEST - 2 VIEW  Comparison: Jun 15, 2005  Findings: Lungs clear.  Heart size and pulmonary vascularity are normal.  No adenopathy.  There is postoperative change in the lower cervical spine.  IMPRESSION: No edema  or consolidation.   Original Report Authenticated By: Bretta Bang, M.D.     Date: 12/06/2011  Rate: 107  Rhythm: sinus tachycardia  QRS Axis: normal  Intervals: normal  ST/T Wave abnormalities: normal  Conduction Disutrbances:none  Narrative Interpretation: Sinus tachycardia, otherwise normal ECG. No significant change from last ECT the  Old EKG Reviewed: unchanged    1. Pleuritic chest pain       MDM  Pleuritic chest pain with tachycardia which is worrisome for possible recurrent pulmonary embolism. Because of his history and the family history, I do not feel that screen with d-dimer would be appropriate and he will be sent for CT angiogram of the chest. Old records were reviewed confirming a hospitalization for pulmonary embolism  CT is negative for pulmonary embolism. He'll be treated symptomatically with oral Biaxin and prescription is given for naproxen. He has a prescription for Percocet at home and is to take that as needed.     Dione Booze, MD 12/06/11 [phone]

## 2011-12-06 NOTE — Telephone Encounter (Signed)
I spoke with patient, patient stated he was on his way to [facility] as we were speaking, patient's wife was driving him   Dr.Hopper verbally informed

## 2011-12-06 NOTE — Telephone Encounter (Signed)
patient calling, he got out of the shower this morning and bent over to dry off.  Immediately had severe, stabbing pain under the left nipple and that radiates into his back.  The pain worsens with a deep breath.  Denies a cold or cough.  States that the pain worsens when he sits down.  Has had a PE in the past and the pain is very similar.  Triaged per Chest Pain.  Sent to be seen now.

## 2011-12-06 NOTE — ED Notes (Signed)
Patient transported to CT 

## 2011-12-06 NOTE — Telephone Encounter (Signed)
Left message on voicemail informing patient if he had not already went to the ER we recommend he go as soon as possible. Patient to call back and confirm message received

## 2011-12-13 ENCOUNTER — Other Ambulatory Visit: Payer: Self-pay

## 2011-12-13 DIAGNOSIS — G4700 Insomnia, unspecified: Secondary | ICD-10-CM

## 2011-12-13 DIAGNOSIS — F419 Anxiety disorder, unspecified: Secondary | ICD-10-CM

## 2011-12-13 NOTE — Telephone Encounter (Signed)
Pt wife LMOVM triage line requesting these meds. I show they were discontinued OV 11/18/10. Pt wife stating pt changing pharmacy to [facility] [place] . PLz advise   MW

## 2011-12-13 NOTE — Telephone Encounter (Signed)
Additional refills require office visit to update medical history. Last OV 10/12    MW (See previous notes)

## 2011-12-13 NOTE — Telephone Encounter (Signed)
Additional refills require office visit to update medical history. Last OV 10/12

## 2011-12-14 NOTE — Telephone Encounter (Signed)
Pt scheduled for OV on Tuesday, 12/20/11.

## 2011-12-20 ENCOUNTER — Ambulatory Visit: Payer: PRIVATE HEALTH INSURANCE | Admitting: Internal Medicine

## 2011-12-20 DIAGNOSIS — Z0289 Encounter for other administrative examinations: Secondary | ICD-10-CM

## 2012-08-17 ENCOUNTER — Other Ambulatory Visit: Payer: Self-pay | Admitting: Neurosurgery

## 2012-08-17 DIAGNOSIS — M4716 Other spondylosis with myelopathy, lumbar region: Secondary | ICD-10-CM

## 2012-08-27 ENCOUNTER — Ambulatory Visit
Admission: RE | Admit: 2012-08-27 | Discharge: 2012-08-27 | Disposition: A | Discharge status: Home / Self Care | Payer: BC Managed Care – PPO | Source: Ambulatory Visit | Attending: Neurosurgery | Admitting: Neurosurgery

## 2012-08-27 DIAGNOSIS — M4716 Other spondylosis with myelopathy, lumbar region: Secondary | ICD-10-CM

## 2012-10-21 ENCOUNTER — Encounter: Payer: Self-pay | Admitting: Internal Medicine

## 2013-07-14 ENCOUNTER — Emergency Department (HOSPITAL_COMMUNITY): Payer: 59

## 2013-07-14 ENCOUNTER — Emergency Department (HOSPITAL_COMMUNITY)
Admission: EM | Admit: 2013-07-14 | Discharge: 2013-07-14 | Disposition: A | Discharge status: Home / Self Care | Payer: 59 | Attending: Emergency Medicine | Admitting: Emergency Medicine

## 2013-07-14 ENCOUNTER — Encounter (HOSPITAL_COMMUNITY): Payer: Self-pay | Admitting: Emergency Medicine

## 2013-07-14 DIAGNOSIS — Z86711 Personal history of pulmonary embolism: Secondary | ICD-10-CM | POA: Insufficient documentation

## 2013-07-14 DIAGNOSIS — F17200 Nicotine dependence, unspecified, uncomplicated: Secondary | ICD-10-CM | POA: Insufficient documentation

## 2013-07-14 DIAGNOSIS — M5416 Radiculopathy, lumbar region: Secondary | ICD-10-CM

## 2013-07-14 DIAGNOSIS — Z791 Long term (current) use of non-steroidal anti-inflammatories (NSAID): Secondary | ICD-10-CM | POA: Insufficient documentation

## 2013-07-14 DIAGNOSIS — IMO0002 Reserved for concepts with insufficient information to code with codable children: Secondary | ICD-10-CM | POA: Insufficient documentation

## 2013-07-14 DIAGNOSIS — Z79899 Other long term (current) drug therapy: Secondary | ICD-10-CM | POA: Insufficient documentation

## 2013-07-14 HISTORY — DX: Reserved for concepts with insufficient information to code with codable children: IMO0002

## 2013-07-14 MED ORDER — KETOROLAC TROMETHAMINE 30 MG/ML IJ SOLN
30.0000 mg | Freq: Once | INTRAMUSCULAR | Status: AC
  Administered 2013-07-14: 30 mg via INTRAVENOUS
  Filled 2013-07-14: qty 1

## 2013-07-14 MED ORDER — METHYLPREDNISOLONE 4 MG PO KIT: PACK | ORAL | Status: DC

## 2013-07-14 MED ORDER — HYDROMORPHONE HCL PF 1 MG/ML IJ SOLN
0.5000 mg | Freq: Once | INTRAMUSCULAR | Status: AC
  Administered 2013-07-14: 0.5 mg via INTRAVENOUS
  Filled 2013-07-14: qty 1

## 2013-07-14 MED ORDER — HYDROMORPHONE HCL PF 1 MG/ML IJ SOLN: 1.0000 mg | Freq: Once | INTRAMUSCULAR | Status: DC

## 2013-07-14 MED ORDER — HYDROMORPHONE HCL PF 1 MG/ML IJ SOLN
1.0000 mg | Freq: Once | INTRAMUSCULAR | Status: AC
  Administered 2013-07-14: 1 mg via INTRAVENOUS
  Filled 2013-07-14: qty 1

## 2013-07-14 MED ORDER — ONDANSETRON HCL 4 MG/2ML IJ SOLN
4.0000 mg | Freq: Once | INTRAMUSCULAR | Status: AC
  Administered 2013-07-14: 4 mg via INTRAVENOUS
  Filled 2013-07-14: qty 2

## 2013-07-14 NOTE — ED Provider Notes (Signed)
CSN: [id number]     Arrival date & time 07/14/13  1413 History   First MD Initiated Contact with Patient 07/14/13 1526     Chief Complaint  Patient presents with  . Back Pain     (Consider location/radiation/quality/duration/timing/severity/associated sxs/prior Treatment) Patient is a 38 y.o. male presenting with back pain. The history is provided by the patient.  Back Pain Location:  Lumbar spine Quality:  Aching, stabbing, stiffness and shooting Stiffness is present:  All day Radiates to:  L posterior upper leg, R posterior upper leg, L foot and R foot Pain severity:  Severe Pain is:  Same all the time Onset quality:  Sudden Duration:  6 days Timing:  Constant Progression:  Worsening Chronicity:  New Context comment:  Known back problems with hx of epidurals and injections but went hiking last weekend and then bent over on monday to put on his shoes and symptoms started Relieved by:  Nothing Worsened by:  Bending, twisting and standing Ineffective treatments:  Ibuprofen, narcotics and bed rest Associated symptoms: leg pain, numbness, tingling and weakness   Associated symptoms: no abdominal pain, no bladder incontinence, no bowel incontinence, no fever and no perianal numbness   Risk factors: no recent surgery and no steroid use     Past Medical History  Diagnosis Date  . Pulmonary emboli   . DDD (degenerative disc disease)    Past Surgical History  Procedure Laterality Date  . Torn labrum      RIGHT SHOULDER  . Appendectomy    . Cervical fusion    . Ddd with lumbosacral radiculopathy     Family History  Problem Relation Age of Onset  . Cancer Father     PROSTATE, SKIN  . Hypertension Father   . Stroke Maternal Grandmother     CVA  . Mental illness Maternal Grandfather     ALZHEIMERS  . Diabetes Paternal Grandfather   . Hypertension Paternal Grandfather   . Cancer Paternal Grandfather     PROSTATE   History  Substance Use Topics  . Smoking status:  Current Every Day Smoker -- 0.25 packs/day    Types: Cigarettes  . Smokeless tobacco: Not on file     Comment: 4-5 CIGS A DAY  . Alcohol Use: No    Review of Systems  Constitutional: Negative for fever.  Gastrointestinal: Negative for abdominal pain and bowel incontinence.  Genitourinary: Negative for bladder incontinence.  Musculoskeletal: Positive for back pain.  Neurological: Positive for tingling, weakness and numbness.  All other systems reviewed and are negative.     Allergies  Codeine  Home Medications   Prior to Admission medications   Medication Sig Start Date End Date Taking? Authorizing Provider  cyclobenzaprine (FLEXERIL) 10 MG tablet Take 10 mg by mouth 3 (three) times daily.    Historical Provider, MD  naproxen (NAPROSYN) 500 MG tablet Take 1 tablet (500 mg total) by mouth 2 (two) times daily. 31/51/76   Delora Fuel, MD  oxyCODONE-acetaminophen (PERCOCET) 10-325 MG per tablet Take 1 tablet by mouth every 4 (four) hours as needed. For pain    Historical Provider, MD   BP 156/96  Pulse 95  Temp(Src) 98 F (36.7 C) (Oral)  Resp 20  SpO2 100% Physical Exam  Nursing note and vitals reviewed. Constitutional: He is oriented to person, place, and time. He appears well-developed and well-nourished. No distress.  HENT:  Head: Normocephalic and atraumatic.  Mouth/Throat: Oropharynx is clear and moist.  Eyes: Conjunctivae and EOM are normal.  Pupils are equal, round, and reactive to light.  Neck: Normal range of motion. Neck supple.  Cardiovascular: Normal rate, regular rhythm and intact distal pulses.   No murmur heard. Pulmonary/Chest: Effort normal and breath sounds normal. No respiratory distress. He has no wheezes. He has no rales.  Abdominal: Soft. He exhibits no distension. There is no tenderness. There is no rebound and no guarding.  Musculoskeletal: Normal range of motion. He exhibits no edema and no tenderness.  Neurological: He is alert and oriented to  person, place, and time.  Reflex Scores:      Patellar reflexes are 0 on the right side and 1+ on the left side.      Achilles reflexes are 0 on the right side and 1+ on the left side. Decreased sensation in the rectum but normal rectal tone.  Decreased sensation in the S1 distribution of the legs.  4/5 strength with foot flexion and extension bilaterally.  Skin: Skin is warm and dry. No rash noted. No erythema.  Psychiatric: He has a normal mood and affect. His behavior is normal.    ED Course  Procedures (including critical care time) Labs Review Labs Reviewed - No data to display  Imaging Review Mr Lumbar Spine Wo Contrast  07/14/2013   CLINICAL DATA:  Bilateral leg numbness, chronic back pain  EXAM: MRI LUMBAR SPINE WITHOUT CONTRAST  TECHNIQUE: Multiplanar, multisequence MR imaging of the lumbar spine was performed. No intravenous contrast was administered.  COMPARISON:  08/27/2012  FINDINGS: The vertebral bodies of the lumbar spine are normal in size and alignment. There is normal bone marrow signal demonstrated throughout the vertebra. Degenerative disc disease with disc desiccation at L4-5 and L5-S1. Disc height loss at L5-S1.  The spinal cord is normal in signal and contour. The cord terminates normally at T12 . The nerve roots of the cauda equina and the filum terminale are normal.  The visualized portions of the SI joints are unremarkable.  Partially visualized is a 2.6 cm T2 hyperintense left renal mass most consistent with a cyst.  T12-L1: No significant disc bulge. No evidence of neural foraminal stenosis. No central canal stenosis.  L1-L2: No significant disc bulge. No evidence of neural foraminal stenosis. No central canal stenosis.  L2-L3: No significant disc bulge. No evidence of neural foraminal stenosis. No central canal stenosis.  L3-L4: Right far lateral disc protrusion abutting the right extra foraminal L3 nerve root. Bilateral mild facet arthropathy. No evidence of neural  foraminal stenosis. No central canal stenosis.  L4-L5: Small shallow central disc protrusion contacting bilateral intraspinal L5 nerve roots. No evidence of neural foraminal stenosis. No central canal stenosis. Mild bilateral facet arthropathy.  L5-S1: Broad-based disc osteophyte complex contacting bilateral intraspinal S1 nerve roots. No evidence of neural foraminal stenosis. No central canal stenosis. Mild facet arthropathy.  IMPRESSION: 1. At L3-4 there is a small right far lateral disc protrusion abutting the right extra foraminal L3 nerve root. 2. At L4-5 is a small shallow central disc protrusion contacting the bilateral intraspinal L5 nerve roots. 3. At L5-S1 there is a broad-based disc osteophyte complex contacting bilateral intraspinal S1 nerve roots. 4. Bilateral facet arthropathy at L3-4, L4-5 and L5-S1. 5. No significant interval change compared with 08/27/2012.   Electronically Signed   By: Kathreen Devoid   On: 07/14/2013 17:48     EKG Interpretation None      MDM   Final diagnoses:  None    Patient with a history of known degenerative disc  disease with prior lumbar injections, epidurals and facet cleanout been doing very well without significant pain until Monday. He got hiking over the weekend and Monday bent over to put his shoes on and developed pain radiating down both legs that has progressed to numbness and mild weakness of both legs. He denies any bowel or bladder incontinence or retention exam has perianal numbness but a normal rectal tone. He has 4/5 strength on foot flexion and extension with numbness in the S1 distribution.  Patient given pain control and will do an MR to evaluate for spinal compression  5:55 PM MRI shows no signs of new pathology or spinal cord impingement.  Pt does have broad based osteophyte pushing on S1 which is most likely the cause of his sx.  No acute reason for NSU emergent intervention.  Pt sees Dr. Carloyn Manner and has already contacted the office.  Will  d/c home with steroids and continue pain meds and f/u this week.  Blanchie Dessert, MD 07/14/13 1757

## 2013-07-14 NOTE — ED Notes (Signed)
Pt reports lower back pain that radiates down bilateral legs. States he has chronic back pain but states that is has progressively worsened since 6/15 after going on a hike. Denies urinary/bowel incontinence. Pt ambulatory, but pain with any movement. Denies injury to back. Hx DDD.

## 2013-07-14 NOTE — ED Notes (Signed)
Patient transported to MRI 

## 2013-07-14 NOTE — ED Notes (Addendum)
Pt. Reports having lower back pain since last Monday and this Friday he bent over to put on his shoes and felt a pop.  Pt. Reports having numbness and tingling to his lower extremities and pelvic area.  Pt. Also reports having blood in his BM.,

## 2013-07-14 NOTE — ED Notes (Signed)
Patient returned from MRI.

## 2013-07-14 NOTE — ED Notes (Signed)
MRI called to obtain a disc of patients MRI today.

## 2013-10-23 ENCOUNTER — Encounter (HOSPITAL_COMMUNITY): Payer: Self-pay | Admitting: Emergency Medicine

## 2013-10-23 ENCOUNTER — Emergency Department (HOSPITAL_COMMUNITY): Payer: 59

## 2013-10-23 ENCOUNTER — Emergency Department (HOSPITAL_COMMUNITY)
Admission: EM | Admit: 2013-10-23 | Discharge: 2013-10-23 | Disposition: A | Discharge status: Home / Self Care | Payer: 59 | Attending: Emergency Medicine | Admitting: Emergency Medicine

## 2013-10-23 DIAGNOSIS — Z9889 Other specified postprocedural states: Secondary | ICD-10-CM | POA: Insufficient documentation

## 2013-10-23 DIAGNOSIS — Y9289 Other specified places as the place of occurrence of the external cause: Secondary | ICD-10-CM | POA: Insufficient documentation

## 2013-10-23 DIAGNOSIS — Y9389 Activity, other specified: Secondary | ICD-10-CM | POA: Diagnosis not present

## 2013-10-23 DIAGNOSIS — M5441 Lumbago with sciatica, right side: Secondary | ICD-10-CM

## 2013-10-23 DIAGNOSIS — F17200 Nicotine dependence, unspecified, uncomplicated: Secondary | ICD-10-CM | POA: Insufficient documentation

## 2013-10-23 DIAGNOSIS — IMO0002 Reserved for concepts with insufficient information to code with codable children: Secondary | ICD-10-CM | POA: Diagnosis present

## 2013-10-23 DIAGNOSIS — X500XXA Overexertion from strenuous movement or load, initial encounter: Secondary | ICD-10-CM | POA: Insufficient documentation

## 2013-10-23 DIAGNOSIS — M5416 Radiculopathy, lumbar region: Secondary | ICD-10-CM

## 2013-10-23 DIAGNOSIS — Z79899 Other long term (current) drug therapy: Secondary | ICD-10-CM | POA: Insufficient documentation

## 2013-10-23 DIAGNOSIS — Z86711 Personal history of pulmonary embolism: Secondary | ICD-10-CM | POA: Insufficient documentation

## 2013-10-23 LAB — CBC WITH DIFFERENTIAL/PLATELET
BASOS ABS: 0 10*3/uL (ref 0.0–0.1)
BASOS PCT: 0 % (ref 0–1)
Eosinophils Absolute: 0.3 10*3/uL (ref 0.0–0.7)
Eosinophils Relative: 3 % (ref 0–5)
HEMATOCRIT: 42.1 % (ref 39.0–52.0)
HEMOGLOBIN: 14.5 g/dL (ref 13.0–17.0)
LYMPHS PCT: 36 % (ref 12–46)
Lymphs Abs: 2.9 10*3/uL (ref 0.7–4.0)
MCH: 31.3 pg (ref 26.0–34.0)
MCHC: 34.4 g/dL (ref 30.0–36.0)
MCV: 90.7 fL (ref 78.0–100.0)
MONO ABS: 0.7 10*3/uL (ref 0.1–1.0)
MONOS PCT: 8 % (ref 3–12)
NEUTROS ABS: 4.3 10*3/uL (ref 1.7–7.7)
NEUTROS PCT: 53 % (ref 43–77)
Platelets: 264 10*3/uL (ref 150–400)
RBC: 4.64 MIL/uL (ref 4.22–5.81)
RDW: 12.1 % (ref 11.5–15.5)
WBC: 8.2 10*3/uL (ref 4.0–10.5)

## 2013-10-23 LAB — I-STAT CHEM 8, ED
BUN: 7 mg/dL (ref 6–23)
CHLORIDE: 104 meq/L (ref 96–112)
Calcium, Ion: 1.26 mmol/L — ABNORMAL HIGH (ref 1.12–1.23)
Creatinine, Ser: 0.7 mg/dL (ref 0.50–1.35)
Glucose, Bld: 93 mg/dL (ref 70–99)
HEMATOCRIT: 44 % (ref 39.0–52.0)
HEMOGLOBIN: 15 g/dL (ref 13.0–17.0)
POTASSIUM: 4.3 meq/L (ref 3.7–5.3)
SODIUM: 139 meq/L (ref 137–147)
TCO2: 25 mmol/L (ref 0–100)

## 2013-10-23 MED ORDER — HYDROMORPHONE HCL 4 MG PO TABS: 4.0000 mg | ORAL_TABLET | ORAL | Status: AC | PRN

## 2013-10-23 MED ORDER — HYDROMORPHONE HCL 1 MG/ML IJ SOLN
1.0000 mg | INTRAMUSCULAR | Status: AC
  Administered 2013-10-23: 1 mg via INTRAVENOUS
  Filled 2013-10-23: qty 1

## 2013-10-23 MED ORDER — METHYLPREDNISOLONE 4 MG PO KIT: PACK | ORAL | Status: AC

## 2013-10-23 MED ORDER — HYDROMORPHONE HCL 1 MG/ML IJ SOLN
1.0000 mg | Freq: Once | INTRAMUSCULAR | Status: AC
  Administered 2013-10-23: 1 mg via INTRAVENOUS
  Filled 2013-10-23: qty 1

## 2013-10-23 MED ORDER — GADOBENATE DIMEGLUMINE 529 MG/ML IV SOLN
20.0000 mL | Freq: Once | INTRAVENOUS | Status: AC | PRN
Start: 2013-10-23 — End: 2013-10-23
  Administered 2013-10-23: 20 mL via INTRAVENOUS

## 2013-10-23 NOTE — Discharge Instructions (Signed)
Please follow with your primary care doctor in the next 2 days for a check-up. They must obtain records for further management.   Do not hesitate to return to the Emergency Department for any new, worsening or concerning symptoms.   Do not drink, drive or operate heavy machinery while taking the Dilaudid because it will slow your reflexes and make you drowsy.  Please follow with your neurosurgeon in your appointment at 3 PM tomorrow.

## 2013-10-23 NOTE — ED Notes (Signed)
Pt had surgery 5 weeks ago on L4 for a rupture. Pt got out of shower this morning and felt a pop in his back while he was bending over- pt fell- hitting face- no LOC. He started having back pain and tingling down right leg and numbness to right toe. Pt received 270mcg  Fentanyl. BP 140/100,HR 90.

## 2013-10-23 NOTE — ED Notes (Signed)
Pt still in MRI 

## 2013-10-23 NOTE — ED Provider Notes (Signed)
Five-week status post lumbar surgery for a right foot drop and numbness. Patient picked up a laundry basket against postop instructions for 3 days ago. He developed severe low back pain and right sided foot drop and numbness similar to symptoms prior to the surgery onset this morning. Surgical site does not appear to be infected. Neurosurgeon Carloyn Manner  MRI shows recurrent small L4-L5 central disc extrusion with a small fluid collection that is expected surgically.  Neurosurgery consult from Dr. Carloyn Manner appreciated: He has evaluated the MRI, recommends discharge and he will see the patient at 3:30 PM tomorrow in his office for evaluation and to plan a fusion.   Filed Vitals:   10/23/13 1700 10/23/13 1723 10/23/13 1730 10/23/13 1738  BP: 119/75 118/79 125/78   Pulse: 88 96 86   Temp:    98.2 F (36.8 C)  TempSrc:    Oral  Resp:      Height:      Weight:      SpO2: 96%  97%     Medications  HYDROmorphone (DILAUDID) injection 1 mg (1 mg Intravenous Given 10/23/13 1125)  HYDROmorphone (DILAUDID) injection 1 mg (1 mg Intravenous Given 10/23/13 1454)  gadobenate dimeglumine (MULTIHANCE) injection 20 mL (20 mLs Intravenous Contrast Given 10/23/13 1616)  HYDROmorphone (DILAUDID) injection 1 mg (1 mg Intravenous Given 10/23/13 1721)    Evaluation does not show pathology that would require ongoing emergent intervention or inpatient treatment. Pt is hemodynamically stable and mentating appropriately. Discussed findings and plan with patient/guardian, who agrees with care plan. All questions answered. Return precautions discussed and outpatient follow up given.   Discharge Medication List as of 10/23/2013  5:29 PM    START taking these medications   Details  HYDROmorphone (DILAUDID) 4 MG tablet Take 1 tablet (4 mg total) by mouth every 4 (four) hours as needed for severe pain., Starting 10/23/2013, Until Discontinued, Print    methylPREDNISolone (MEDROL DOSEPAK) 4 MG tablet As directed by package insert, Print            Monico Blitz, PA-C 10/23/13 1933

## 2013-10-23 NOTE — ED Provider Notes (Signed)
CSN: [id number]     Arrival date & time 10/23/13  4627 History   First MD Initiated Contact with Patient 10/23/13 1002     Chief Complaint  Patient presents with  . Back Pain     (Consider location/radiation/quality/duration/timing/severity/associated sxs/prior Treatment) The history is provided by the patient and a significant other.    Pt with hx lumbar surgery 5 weeks ago p/w acute change in his symptoms after bending over to put on his pants this morning.  States when he bent over he felt a pop, fell face forward, pain 10/10 with any movement, new weakness/numbness in his right leg that is similar to what he had before surgery.  Since his surgery he has had very little pain, and had improving right foot drop and improving right lower extremity numbness/tingling.    Pain is 5/10 without movement.  Neurosurgeon is Dr Carloyn Manner, [facility].   Past Medical History  Diagnosis Date  . Pulmonary emboli   . DDD (degenerative disc disease)    Past Surgical History  Procedure Laterality Date  . Torn labrum      RIGHT SHOULDER  . Appendectomy    . Cervical fusion    . Ddd with lumbosacral radiculopathy    . Appendectomy     Family History  Problem Relation Age of Onset  . Cancer Father     PROSTATE, SKIN  . Hypertension Father   . Stroke Maternal Grandmother     CVA  . Mental illness Maternal Grandfather     ALZHEIMERS  . Diabetes Paternal Grandfather   . Hypertension Paternal Grandfather   . Cancer Paternal Grandfather     PROSTATE   History  Substance Use Topics  . Smoking status: Current Every Day Smoker -- 0.25 packs/day    Types: Cigarettes  . Smokeless tobacco: Not on file     Comment: 4-5 CIGS A DAY  . Alcohol Use: No    Review of Systems  All other systems reviewed and are negative.     Allergies  Codeine  Home Medications   Prior to Admission medications   Medication Sig Start Date End Date Taking? Authorizing Provider  cyclobenzaprine (FLEXERIL) 10 MG  tablet Take 10 mg by mouth 3 (three) times daily.   Yes Historical Provider, MD  naproxen sodium (ANAPROX) 220 MG tablet Take 440 mg by mouth daily.    Yes Historical Provider, MD  oxyCODONE-acetaminophen (PERCOCET) 10-325 MG per tablet Take 1 tablet by mouth every 4 (four) hours as needed for pain. For pain   Yes Historical Provider, MD   BP 127/81  Pulse 85  Temp(Src) 98.2 F (36.8 C) (Oral)  Resp 16  Ht 6\' 5"  (1.956 m)  Wt 240 lb (108.863 kg)  BMI 28.45 kg/m2  SpO2 98% Physical Exam  Nursing note and vitals reviewed. Constitutional: He appears well-developed and well-nourished. No distress.  HENT:  Head: Normocephalic and atraumatic.  Neck: Neck supple.  Pulmonary/Chest: Effort normal.  Abdominal: Soft. He exhibits no distension and no mass. There is no tenderness. There is no rebound and no guarding.  Musculoskeletal:       Back:  Right lower extremity with 3-4/5 strength throughout.  Sensation altered.  Distal pulses intact.   Left lower extremity:  Strength 5/5, sensation intact, distal pulses intact.     Neurological: He is alert.  Skin: He is not diaphoretic.    ED Course  Procedures (including critical care time) Labs Review Labs Reviewed  I-STAT CHEM 8, ED -  Abnormal; Notable for the following:    Calcium, Ion 1.26 (*)    All other components within normal limits  CBC WITH DIFFERENTIAL    Imaging Review No results found.   EKG Interpretation None      MDM   Final diagnoses:  Right-sided low back pain with right-sided sciatica  Lumbar radiculopathy   Afebrile nontoxic patient with lumbar surgery 5 weeks ago, p/w acute increase in pain this morning with bending over, hearing pop.  Pt now with 10/10 pain in low back and down right leg, with return of RLE numbness/tingling, and weakness.  Pt previously only had leg symptoms and no back symptoms, but has been relatively pain free following surgery.  Pending MRI at change of shift.  Anticipate discussion  with neurosurgeon Dr Carloyn Manner after MRI results.  Discussed pt with Monico Blitz, PA-C, who assumes care of patient at change of shift.     [place], PA-C 10/23/13 [phone]

## 2013-10-31 NOTE — ED Provider Notes (Signed)
Medical screening examination/treatment/procedure(s) were performed by non-physician practitioner and as supervising physician I was immediately available for consultation/collaboration.   EKG Interpretation None        Tanna Furry, MD 10/31/13 [phone]

## 2013-10-31 NOTE — ED Provider Notes (Signed)
Medical screening examination/treatment/procedure(s) were performed by non-physician practitioner and as supervising physician I was immediately available for consultation/collaboration.   EKG Interpretation None        Tanna Furry, MD 10/31/13 [phone]

## 2014-02-21 ENCOUNTER — Other Ambulatory Visit: Payer: Self-pay | Admitting: Neurosurgery

## 2014-02-21 ENCOUNTER — Ambulatory Visit
Admission: RE | Admit: 2014-02-21 | Discharge: 2014-02-21 | Disposition: A | Discharge status: Home / Self Care | Payer: 59 | Source: Ambulatory Visit | Attending: Neurosurgery | Admitting: Neurosurgery

## 2014-02-21 DIAGNOSIS — M5126 Other intervertebral disc displacement, lumbar region: Secondary | ICD-10-CM

## 2014-06-17 ENCOUNTER — Other Ambulatory Visit: Payer: Self-pay | Admitting: *Deleted

## 2015-02-20 ENCOUNTER — Other Ambulatory Visit: Payer: Self-pay | Admitting: Neurosurgery

## 2015-02-20 DIAGNOSIS — M47812 Spondylosis without myelopathy or radiculopathy, cervical region: Secondary | ICD-10-CM

## 2015-02-27 ENCOUNTER — Ambulatory Visit
Admission: RE | Admit: 2015-02-27 | Discharge: 2015-02-27 | Disposition: A | Discharge status: Home / Self Care | Payer: BLUE CROSS/BLUE SHIELD | Source: Ambulatory Visit | Attending: Neurosurgery | Admitting: Neurosurgery

## 2015-02-27 DIAGNOSIS — M47812 Spondylosis without myelopathy or radiculopathy, cervical region: Secondary | ICD-10-CM

## 2015-03-02 ENCOUNTER — Telehealth: Payer: Self-pay | Admitting: Internal Medicine

## 2015-03-02 NOTE — Telephone Encounter (Signed)
Last saw Dr Linna Darner 11/18/2010.do not see any future appts scheduled. Please advise.

## 2015-03-02 NOTE — Telephone Encounter (Signed)
Patient Name: Shawn [name] DOB: 1975-07-01 Initial Comment Caller states husband has a pinched nerve in neck, been getting very bad headaches, blurred vision in right eye, dizzy when standing. Been having nose bleeds, started yesterday. 160/110 most recent reading. Used to see Dr Linna Darner Nurse Assessment Nurse: Ronnald Ramp, RN, Miranda Date/Time Eilene Ghazi Time): 03/02/2015 9:11:55 AM Confirm and document reason for call. If symptomatic, describe symptoms. You must click the next button to save text entered. ---[name] has been having neck pain, headache, dizziness, and blurred vision in his right eye that it getting worse. He was seen by Neurosurgeon 10 days ago and had an MRI on Friday. Yesterday he head a nose bleed and his BP has also been running high. Today BP was 117/90 but the high has been 160/110. Has the patient traveled out of the country within the last 30 days? ---No Does the patient have any new or worsening symptoms? ---Yes Will a triage be completed? ---Yes Related visit to physician within the last 2 weeks? ---Yes Does the PT have any chronic conditions? (i.e. diabetes, asthma, etc.) ---No Is this a behavioral health or substance abuse call? ---No Guidelines Guideline Title Affirmed Question Affirmed Notes High Blood Pressure BP # 160/100 Final Disposition User See PCP When Office is Open (within 3 days) Ronnald Ramp, RN, [employer] verified the pt has not been seen in > 4 years. He has a current PCP at another location. Caller states she called the office this morning and they told her he would need a new pt appt but the did not have anything soon. Told caller to contact current PCP and neurosurgeons office for treatment and then if she still wants a new pt appt to call back to scheduling line. Disagree/Comply: Comply

## 2015-03-05 ENCOUNTER — Ambulatory Visit
Admission: RE | Admit: 2015-03-05 | Discharge: 2015-03-05 | Disposition: A | Discharge status: Home / Self Care | Payer: BLUE CROSS/BLUE SHIELD | Source: Ambulatory Visit | Attending: Cardiology | Admitting: Cardiology

## 2015-03-05 ENCOUNTER — Other Ambulatory Visit: Payer: Self-pay | Admitting: Cardiology

## 2015-03-05 DIAGNOSIS — R0602 Shortness of breath: Secondary | ICD-10-CM

## 2015-08-22 ENCOUNTER — Other Ambulatory Visit: Payer: Self-pay | Admitting: Neurosurgery

## 2015-08-22 DIAGNOSIS — M47812 Spondylosis without myelopathy or radiculopathy, cervical region: Secondary | ICD-10-CM

## 2015-09-04 ENCOUNTER — Ambulatory Visit
Admission: RE | Admit: 2015-09-04 | Discharge: 2015-09-04 | Disposition: A | Discharge status: Home / Self Care | Payer: BLUE CROSS/BLUE SHIELD | Source: Ambulatory Visit | Attending: Neurosurgery | Admitting: Neurosurgery

## 2015-09-04 DIAGNOSIS — M47812 Spondylosis without myelopathy or radiculopathy, cervical region: Secondary | ICD-10-CM

## 2016-02-03 IMAGING — MR MR LUMBAR SPINE WO/W CM
4 of 7 series · 20 of 48 positions shown · IV contrast (multihance)
Comparison: 07/14/2013.

CLINICAL DATA: Surgery on the low back 5 weeks ago. L4 disc
rupture. Patient felt a pop in the back this morning. Subsequent
fall. Back pain and tingling down the RIGHT leg.

EXAM:
MRI LUMBAR SPINE WITHOUT AND WITH CONTRAST
TECHNIQUE: Multiplanar and multiecho pulse sequences of the lumbar spine were
obtained without and with intravenous contrast.
CONTRAST:  20mL MULTIHANCE GADOBENATE DIMEGLUMINE 529 MG/ML IV SOLN

[Series 3: T2 · sagittal · 4.0mm · 0.55mm/px · 4 of 15 slices shown (1 of 2)]
[im 1/15]
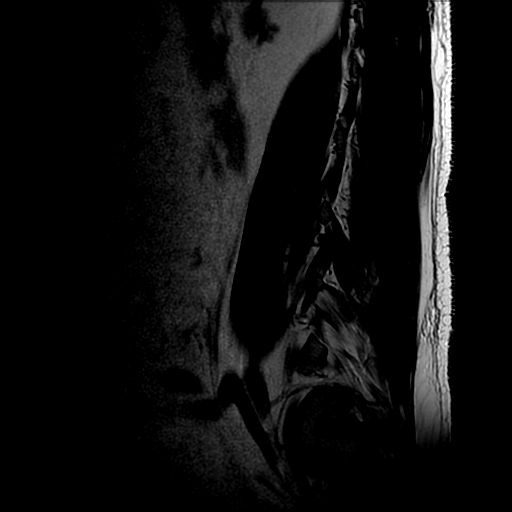
[im 5/15]
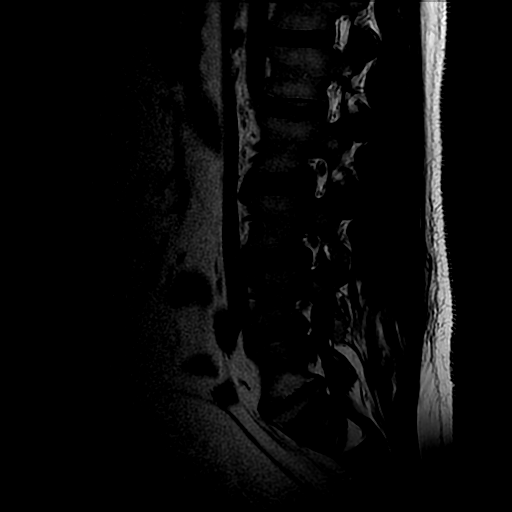
[im 10/15]
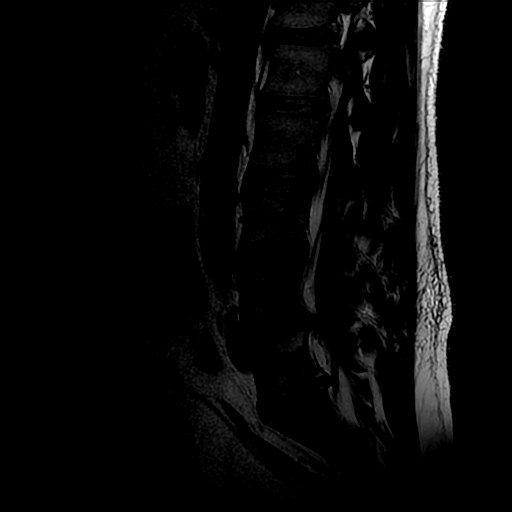
[im 15/15]
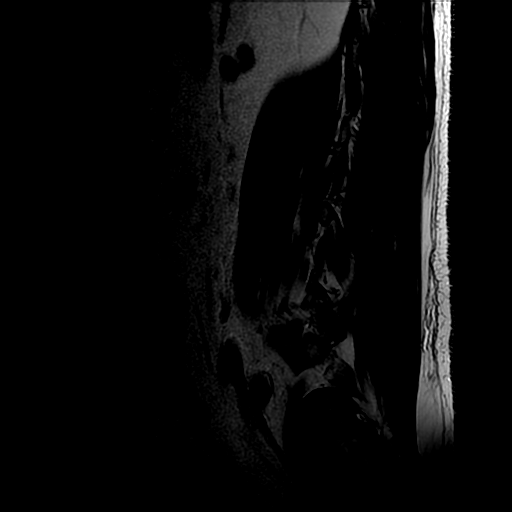

[Series 4: T1 · sagittal · 4.0mm · 0.55mm/px · 3 of 15 slices shown (1 of 2)]
[im 1/15]
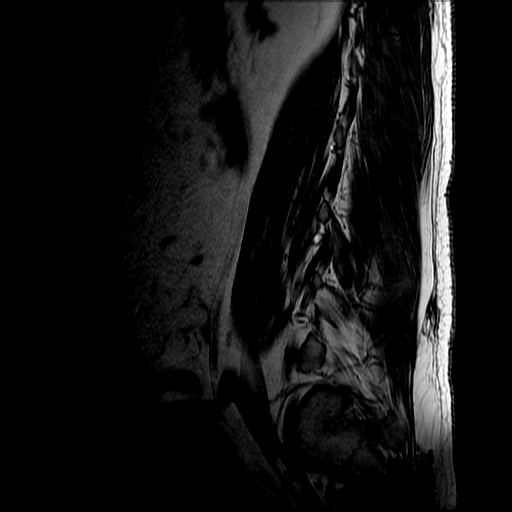
[im 10/15]
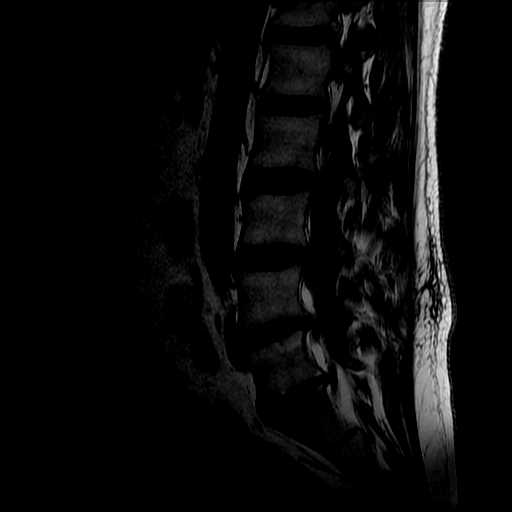
[im 15/15]
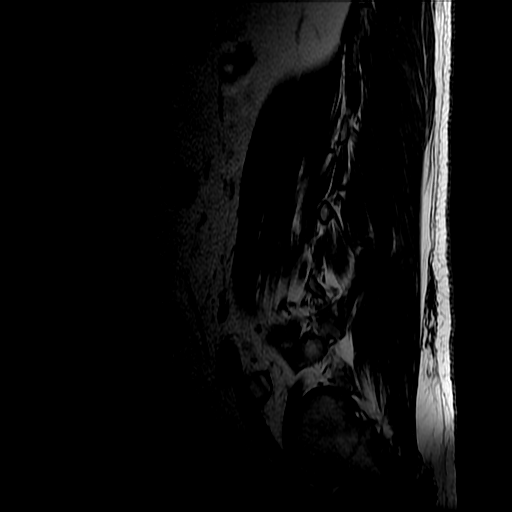

[Series 6: T2 · axial · 4.0mm · 0.39mm/px · z∈[-184,+57]mm · 10 of 49 slices shown (2 of 2)]
[im 1/49]
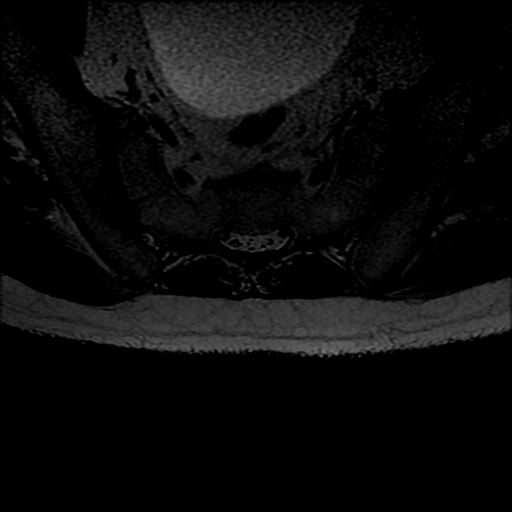
[im 5/49]
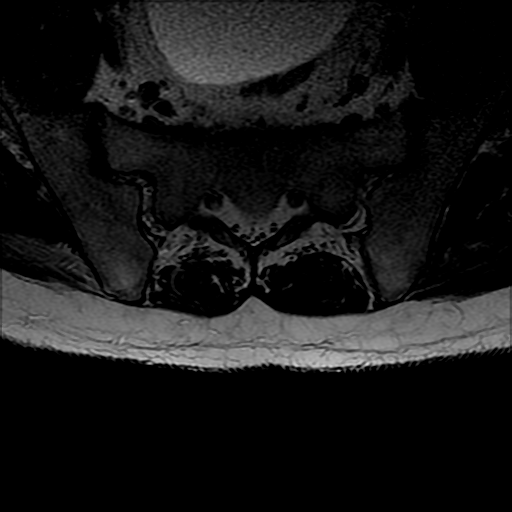
[im 10/49]
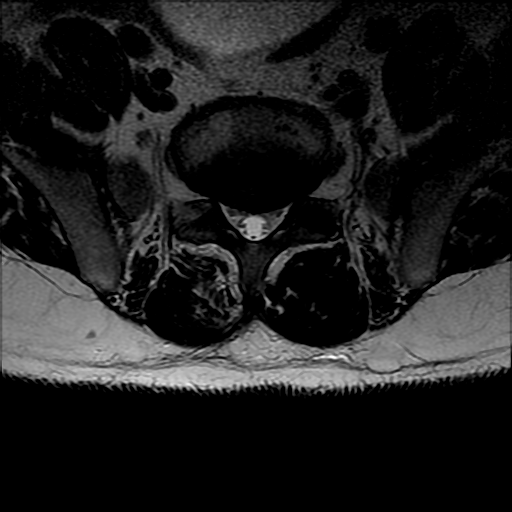
[im 15/49]
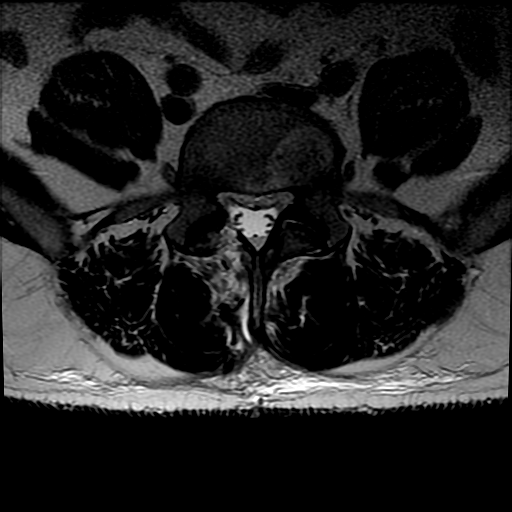
[im 20/49]
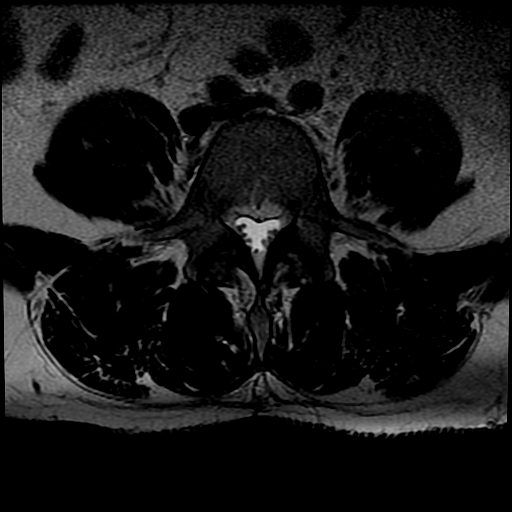
[im 25/49]
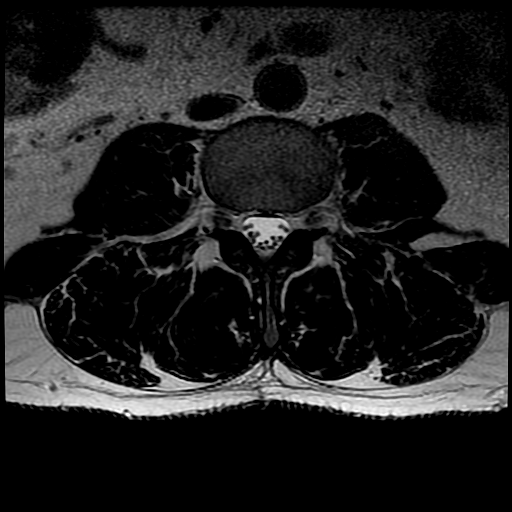
[im 29/49]
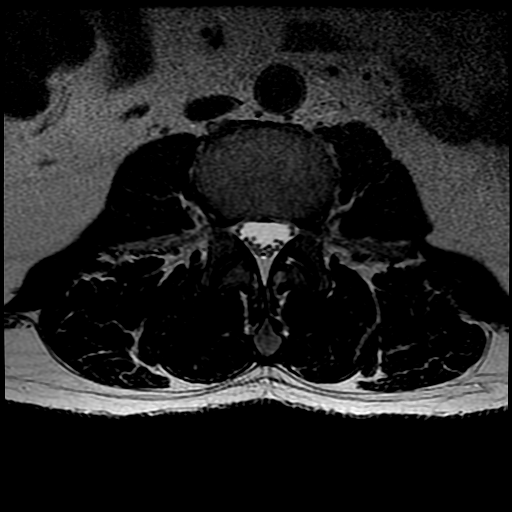
[im 34/49]
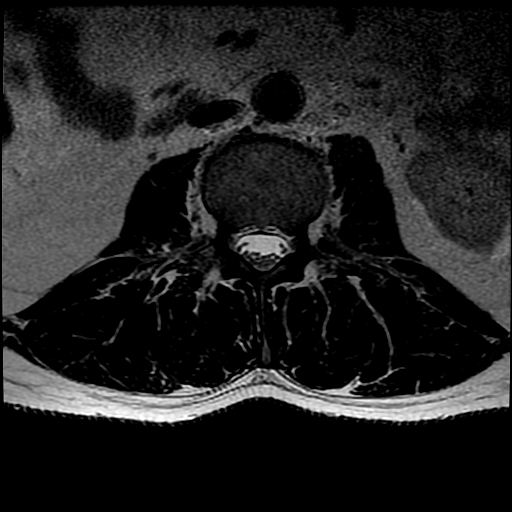
[im 39/49]
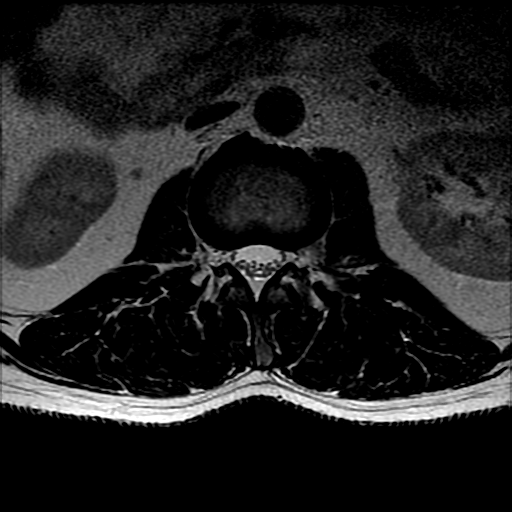
[im 44/49]
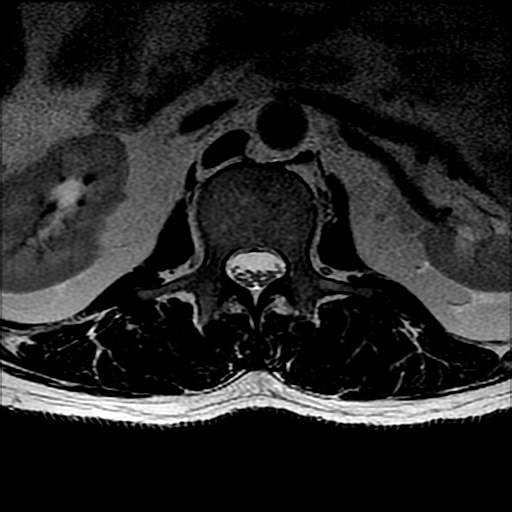

[Series 7: T1 · axial · 4.0mm · 0.78mm/px · z∈[-165,+57]mm · 3 of 49 slices shown (2 of 2)]
[im 5/49]
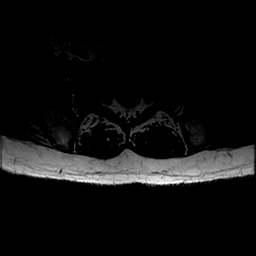
[im 25/49]
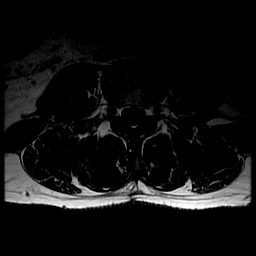
[im 44/49]
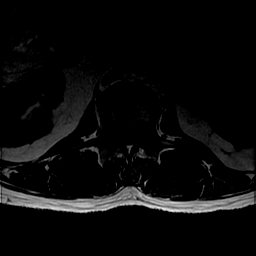

[20 of 48 positions shown; findings below may reference images not displayed]

FINDINGS: Segmentation: Numbering used on prior exam preserved. 5 lumbar type
vertebral bodies.

Alignment:  Normal and unchanged.

Vertebrae: Degenerative endplate changes at L5-S1. Normal vertebral
body height. Scattered Schmorl's nodes.

Conus medullaris: Normal termination at T12-L1.

Paraspinal tissues: Distended urinary bladder. Cyst in the LEFT
kidney.

Disc levels:

T12-L1 through L2-L3 discs appear normal.

L3-L4: Mild disc desiccation. RIGHT lateral and foraminal annular
tear with protrusion potentially affecting the RIGHT L3 nerve. No
change from prior. Central canal and lateral recesses patent.

L4-L5: Interval RIGHT laminotomy. Small fluid collection in the
laminotomy bed measuring 16 mm x 9 mm, probably a postoperative
hematoma based on intrinsic T1 signal. Central disc extrusion is
present with cranial migration of disc material in the midline. This
produces mild central stenosis which is mitigated by the RIGHT
laminotomy. Encroachment on the descending L5 nerve roots, greater
on the LEFT than RIGHT associated with the extrusion. Mild LEFT
foraminal stenosis associated with loss of disc height.

L5-S1: Unchanged. Disc desiccation and degeneration. Central disc
protrusion and endplate osteophytes does contact the descending S1
nerves.
IMPRESSION: 1. Recurrent small L4-L5 central disc extrusion with cranial
migration of disc material status post recent RIGHT laminotomy.
Small fluid collection and enhancing granulation tissue in the
laminotomy bed are expected postsurgical findings.
2. Unchanged L3-L4 and L5-S1 degenerative disease.

## 2020-11-26 ENCOUNTER — Ambulatory Visit: Admit: 2020-11-26 | Discharge: 2020-11-26 | Payer: BLUE CROSS/BLUE SHIELD | Attending: Family

## 2020-11-26 DIAGNOSIS — R059 Cough, unspecified: Secondary | ICD-10-CM

## 2020-11-26 MED ORDER — PREDNISONE 20 MG PO TABS
20 MG | ORAL_TABLET | ORAL | 0 refills | Status: AC
Start: 2020-11-26 — End: ?

## 2020-11-26 MED ORDER — CEFTRIAXONE SODIUM 1 G IJ SOLR
1 g | Freq: Once | INTRAMUSCULAR | Status: AC
Start: 2020-11-26 — End: 2020-11-26
  Administered 2020-11-26: 14:00:00 1000 mg via INTRAMUSCULAR

## 2020-11-26 MED ORDER — AZITHROMYCIN 250 MG PO TABS
250 MG | ORAL_TABLET | ORAL | 0 refills | Status: AC
Start: 2020-11-26 — End: ?

## 2020-11-26 NOTE — Progress Notes (Signed)
Don Love (DOB:  1975/08/16) is a 45 y.o. male here for evaluation of the following chief complaint(s):  Cough (PAIN IN CHEST WHEN COUGHING X ~2wks), Shortness of Breath (Onset 6 days continuing to get worse ), Hemoptysis (States coughing up blood last night), and Other (Patient states he assumed he had the flu due to children testing positive a couple weeks ago/* States symptom onset 11/14/20, also made attempt to go to ED last night left due to wait time)      Subjective   SUBJECTIVE/OBJECTIVE:  HPI  Here for evaluation of cough present for the past 2 weeks.  Reports initially started with flu like symptoms with fevers, chills, fatigue, nasal congestion and cough.  Reports symptoms improved except for cough.  Now reports continued productive cough that is blood tinged, cp with coughing, sob which is worsening.  He currently reports subjective fevers and chills at night, vomiting after coughing.  Denies nausea and diarrhea.  Has tried mucinex, nyquil, hot tea with honey with partial relief of symptoms.  Has not tried any other otc medications or home remedies for relief.  No other exacerbating or relieving factors noted.    Review of Systems  See HPI, all other systems reviewed and are negative        Objective   Physical Exam  Vitals and nursing note reviewed.   Constitutional:       Appearance: Normal appearance.   HENT:      Head: Normocephalic and atraumatic.      Right Ear: Tympanic membrane, ear canal and external ear normal.      Left Ear: Tympanic membrane, ear canal and external ear normal.      Nose: Nose normal.      Mouth/Throat:      Mouth: Mucous membranes are moist.      Pharynx: Oropharynx is clear.   Cardiovascular:      Rate and Rhythm: Normal rate and regular rhythm.      Heart sounds: Normal heart sounds.   Pulmonary:      Effort: No respiratory distress.      Breath sounds: Wheezing present.   Skin:     General: Skin is warm and dry.   Neurological:      General: No focal deficit  present.      Mental Status: He is alert and oriented to person, place, and time.   Psychiatric:         Mood and Affect: Mood normal.         Behavior: Behavior normal.         Thought Content: Thought content normal.         Judgment: Judgment normal.               ASSESSMENT/PLAN:  1. Cough, unspecified type  -     XR CHEST (2 VW); Future  2. Pneumonia of right lung due to infectious organism, unspecified part of lung  -     azithromycin (ZITHROMAX) 250 MG tablet; Take 2 tablets on day 1 for one dose, then take 1 tablet once daily for an additional 4 days, Disp-6 tablet, R-0Normal  -     predniSONE (DELTASONE) 20 MG tablet; Take two tablets once daily in the morning., Disp-10 tablet, R-0Normal  -     cefTRIAXone (ROCEPHIN) injection 1,000 mg; 1,000 mg, IntraMUSCular, ONCE, 1 dose, On Thu 11/26/20 at 1015Antimicrobial Indications: Pneumonia (CAP)CAP duration of therapy: OtherOther CAP Duration: onceReconstitute with 3.6 mL 1% lidocaine  or sterile water.      Results for orders placed or performed in visit on 11/26/20   XR CHEST (2 VW)    Narrative    **Final**  Chest PA and lateral: 11/26/20    INDICATION: "R05.9 Cough, unspecified;R05.9 Cough, unspecified".    COMPARISON: None    FINDINGS: Mild patchy airspace opacity in the mid right lung and medial right  lung base. No lobar consolidation. Findings suspicious for pneumonia/infection  given reported clinical history. 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  Clinical correlation is advised and consider follow-up cross-sectional imaging  as indicated. Comparison to prior cross-sectional imaging may be useful.    IMPRESSION:  No lobar consolidation. There are however patchy airspace opacities in the mid  right lung and medial right lung base which are suspicious for infection given  reported clinical history. No prior studies available for comparison.     Curvilinear density along the  medial right lung base which can be seen in the  setting of scimitar syndrome. Comparison to prior studies of useful. Consider  postcontrast CT of the chest as indicated.      Releasing Radiologist:   Casilda Carls  Released Date and Time:  11/26/20 09:36      Discussed x-ray results with patient.  Rocephin given at time of visit.  Patient instructed to complete entire course of antibiotic with food to minimize GI upset, add yogurt or probiotics daily until completed.  Take prednisone as prescribed with food to minimize GI upset, side effects reviewed, avoid NSAIDs while taking steroids.  Patient encouraged to maintain adequate rest and hydration.  Patient advised that cough may persist for several weeks.  Patient instructed to follow up with PCP within 1 week.  Patient instructed to return if high fever, worsening of symptoms, or if no better in 1 week.  Patient instructed to go to ER if chest pain or difficulty breathing    Return if symptoms worsen or fail to improve, for re-evaluation.             An electronic signature was used to authenticate this note.    --Glade Nurse, APRN - NP

## 2021-06-23 ENCOUNTER — Ambulatory Visit
Admit: 2021-06-23 | Discharge: 2021-06-23 | Payer: BLUE CROSS/BLUE SHIELD | Attending: Family Medicine | Primary: Diagnostic Radiology

## 2021-06-23 ENCOUNTER — Ambulatory Visit: Admit: 2021-06-23 | Discharge: 2021-06-23 | Payer: BLUE CROSS/BLUE SHIELD | Primary: Diagnostic Radiology

## 2021-06-23 DIAGNOSIS — M25571 Pain in right ankle and joints of right foot: Secondary | ICD-10-CM

## 2021-06-23 MED ORDER — MELOXICAM 15 MG PO TABS
15 MG | ORAL_TABLET | Freq: Every day | ORAL | 0 refills | Status: AC
Start: 2021-06-23 — End: 2021-07-03

## 2021-06-23 NOTE — Progress Notes (Unsigned)
Patient: Don Love  MRN: [id number]  Age: 46 y.o.  DOB: 1975-04-24      SUBJECTIVE    History of Present Illness:   46 y.o. male presents to the clinic for:  Chief Complaint   Patient presents with    Ankle Pain     Right ankle and foot pain.  Started about 4 weeks ago and getting worse.  Denies injury.  Swelling at night.       HPI  Right ankle pain x 4 weeks. Gradually worsening. Denies injury or increased activity. Does not run. Started off as mild pain at the end of the work day. Now bad all throughout the day. Denies paresthesias or other joint pains or rashes. No new shoes. He has been taking 400mg  ibuprofen TID with some relief.      Medical History  The following portions of the patient's history were reviewed and updated as appropriate: allergies, current medications, past family history, past medical history, past social history, past surgical history, problem list, pertinent lab work and pertinent imaging.    Allergies   Allergen Reactions    Codeine Rash     There is no problem list on file for this patient.     Current Medications    Current Outpatient Medications:     [facility] ASPIRIN ADULT LOW DOSE 81 MG chewable tablet, TAKE 1 TABLET BY MOUTH EVERY DAY FOR BLOOD CLOT PREVENTION, Disp: , Rfl:     valsartan (DIOVAN) 40 MG tablet, TAKE 1 TABLET BY MOUTH ONCE PER DAY FOR HYPERTENSION (Patient not taking: Reported on 06/23/2021), Disp: , Rfl:     azithromycin (ZITHROMAX) 250 MG tablet, Take 2 tablets on day 1 for one dose, then take 1 tablet once daily for an additional 4 days (Patient not taking: Reported on 06/23/2021), Disp: 6 tablet, Rfl: 0    predniSONE (DELTASONE) 20 MG tablet, Take two tablets once daily in the morning. (Patient not taking: Reported on 06/23/2021), Disp: 10 tablet, Rfl: 0      OBJECTIVE:  Vitals:    06/23/21 1316   BP: 130/74   Site: Left Upper Arm   Position: Sitting   Cuff Size: Large Adult   Pulse: 76   Resp: 16   Temp: 98.3 F (36.8 C)   TempSrc: Oral   SpO2: 97%   Weight:  240 lb (108.9 kg)   Height: 6\' 5"  (1.956 m)     Body mass index is 28.46 kg/m.    Physical Exam  General: alert, well-developed, well-nourished, in no distress  Head: atraumatic, normocephalic  Eyes: EOMI, conjunctiva normal  Ears: Pinnae normal  Nose: Normal external appearance  Neck: supple, full ROM  Cardio: RRR, no murmurs, radial pulses 2+  Resp: Normal work of breathing, clear to auscultation BL  MSK: Right ankle/foot - no gross deformity or ecchymosis. Tender to palpation and slight edema posterior lateral malleolus. No achilles pain or plantar fascia pain. +calcaneal squeeze. Full ROM. DP pulse 2+.  Neuro: alert and oriented x3, normal speech, normal gait       ASSESSMENT & PLAN    Brief history and plan:     1. Acute right ankle pain  -     XR ANKLE RIGHT (MIN 3 VIEWS); Future         Follow Up:  No follow-ups on file.    06/25/21, MD  [facility]

## 2021-06-25 ENCOUNTER — Encounter
Payer: BLUE CROSS/BLUE SHIELD | Attending: Student in an Organized Health Care Education/Training Program | Primary: Diagnostic Radiology

## 2021-06-30 ENCOUNTER — Encounter: Attending: Student in an Organized Health Care Education/Training Program | Primary: Diagnostic Radiology

## 2021-10-13 ENCOUNTER — Ambulatory Visit
Admit: 2021-10-13 | Discharge: 2021-10-13 | Payer: BLUE CROSS/BLUE SHIELD | Attending: Student in an Organized Health Care Education/Training Program | Primary: Family Medicine

## 2021-10-13 DIAGNOSIS — Z0000 Encounter for general adult medical examination without abnormal findings: Secondary | ICD-10-CM

## 2021-10-13 MED ORDER — TRAZODONE HCL 50 MG PO TABS
50 MG | ORAL_TABLET | Freq: Every evening | ORAL | 0 refills | Status: AC | PRN
Start: 2021-10-13 — End: ?

## 2021-10-13 NOTE — Progress Notes (Unsigned)
10/13/2021    Don Love (DOB:  1975/12/05) is a 46 y.o. male, here to establish care with Dr.Mcmonigle. He was following with [facility] primary previously    Social history: Smoking: 15 years - 1/2 pack per day, Etoh: one drinks per week. Lives at home with wife. Originally from [place], [place]. Occupation: Chief Strategy Officer for [employer]    Prev: Colonoscopy last October - on a 5 years schedule       Patient Active Problem List   Diagnosis    Hypertension    TIA (transient ischemic attack)    Primary insomnia       Review of Systems    Prior to Visit Medications    Not on File        Allergies   Allergen Reactions    Codeine Rash       Past Medical History:   Diagnosis Date    Hypertension        Past Surgical History:   Procedure Laterality Date    ANTERIOR CERVICAL DISCECTOMY W/ FUSION      APPENDECTOMY      KNEE ARTHROPLASTY      ROTATOR CUFF REPAIR      SHOULDER ARTHROSCOPY W/ LABRAL REPAIR      SPINAL FUSION      L4/5    VASECTOMY         Social History     Socioeconomic History    Marital status: Married     Spouse name: Not on file    Number of children: Not on file    Years of education: Not on file    Highest education level: Not on file   Occupational History    Not on file   Tobacco Use    Smoking status: Former     Packs/day: 0.50     Years: 15.00     Additional pack years: 0.00     Total pack years: 7.50     Types: Cigarettes     Quit date: 04/24/2020     Years since quitting: 1.4     Passive exposure: Never    Smokeless tobacco: Never   Vaping Use    Vaping Use: Never used   Substance and Sexual Activity    Alcohol use: Yes     Comment: socially    Drug use: Never    Sexual activity: Not on file   Other Topics Concern    Not on file   Social History Narrative    Not on file     Social Determinants of Health     Financial Resource Strain: Not on file   Food Insecurity: Not on file   Transportation Needs: Not on file   Physical Activity: Not on file   Stress: Not on file   Social  Connections: Not on file   Intimate Partner Violence: Not on file   Housing Stability: Not on file        Family History   Problem Relation Age of Onset    Cancer Mother         Breast    Dementia Mother     Cancer Father         Prostate         Vitals:    10/13/21 1330   BP: 122/82   Pulse: 86   SpO2: 98%   Weight: 242 lb 1.6 oz (109.8 kg)   Height: 6\' 5"  (1.956 m)     Estimated  body mass index is 28.71 kg/m as calculated from the following:    Height as of this encounter: 6\' 5"  (1.956 m).    Weight as of this encounter: 242 lb 1.6 oz (109.8 kg).    General Appearance:  awake, alert, oriented, in no acute distress and well developed, well nourished  Skin:  Skin color, texture, turgor normal. No rashes or lesions.  Eyes:  No gross abnormalities., PERRL, and EOMI  Ears:  canals and TMs NI  Nose/Sinuses:  Nares normal. Septum midline. Mucosa normal. No drainage or sinus tenderness.  Mouth/Throat:  Mucosa moist.  No lesions.  Pharynx without erythema, edema or exudate.  Neck:  neck- supple, no mass, non-tender  Lungs:  Normal expansion.  Clear to auscultation.  No rales, rhonchi, or wheezing.  Heart:  Heart sounds are normal.  Regular rate and rhythm without murmur, gallop or rub.  Abdomen:  Soft, non-tender  Neurologic:  negative           No data to display                No results found for: "CHOL", "CHOLFAST", "TRIG", "TRIGLYCFAST", "HDL", "LDLCHOLESTEROL", "LDLCALC", "GLUF", "GLUCOSE", "LABA1C"    The ASCVD Risk score (Arnett DK, et al., 2019) failed to calculate for the following reasons:    Cannot find a previous HDL lab    Cannot find a previous total cholesterol lab      There is no immunization history on file for this patient.    Health Maintenance   Topic Date Due    Hepatitis B vaccine (1 of 3 - 3-dose series) Never done    COVID-19 Vaccine (1) Never done    Depression Screen  Never done    HIV screen  Never done    Hepatitis C screen  Never done    DTaP/Tdap/Td vaccine (1 - Tdap) Never done    Diabetes  screen  Never done    Lipids  Never done    Flu vaccine (1) Never done    Colorectal Cancer Screen  01/24/2025    Hepatitis A vaccine  Aged Out    Hib vaccine  Aged Out    Meningococcal (ACWY) vaccine  Aged Out    Pneumococcal 0-64 years Vaccine  Aged Out       Assessment & Plan   Encounter for general adult medical examination without abnormal findings  Primary hypertension  TIA (transient ischemic attack)  Primary insomnia    No follow-ups on file.         --Harold Hedge, PA

## 2021-10-28 ENCOUNTER — Encounter: Payer: BLUE CROSS/BLUE SHIELD | Attending: Family Medicine | Primary: Family Medicine

## 2022-09-28 NOTE — Telephone Encounter (Signed)
Patient is scheduled for a physical     Please send labs

## 2022-09-29 NOTE — Telephone Encounter (Signed)
 Called pt and lvm to let him know that his labs are in the system

## 2022-09-29 NOTE — Telephone Encounter (Signed)
Please advise labs ?

## 2022-09-30 NOTE — Telephone Encounter (Signed)
 I called the pt and he said that he spoke to someone in the office yesterday who already shared the message with him.

## 2022-10-04 ENCOUNTER — Encounter

## 2022-10-04 LAB — COMPREHENSIVE METABOLIC PANEL
ALT: 24 U/L (ref 0–50)
AST: 24 U/L (ref 0–50)
Albumin/Globulin Ratio: 1.6 (ref 1.00–2.70)
Albumin: 4.6 g/dL (ref 3.5–5.2)
Alk Phosphatase: 42 U/L (ref 40–130)
Anion Gap: 12 mmol/L (ref 2–17)
BUN: 11 mg/dL (ref 6–20)
CO2: 24 mmol/L (ref 22–29)
Calcium: 9.5 mg/dL (ref 8.5–10.7)
Chloride: 103 mmol/L (ref 98–107)
Creatinine: 0.9 mg/dL (ref 0.7–1.3)
Est, Glom Filt Rate: 105 mL/min/1.73mÂ² (ref >=60)
Globulin: 2.8 g/dL (ref 1.9–4.4)
Glucose: 88 mg/dL (ref 70–99)
Osmolaliy Calculated: 276 mosm/kg (ref 270–287)
Potassium: 4.5 mmol/L (ref 3.5–5.3)
Sodium: 139 mmol/L (ref 135–145)
Total Bilirubin: 0.39 mg/dL (ref 0.00–1.20)
Total Protein: 7.4 g/dL (ref 5.7–8.3)

## 2022-10-04 LAB — HEMOGLOBIN A1C
Estimated Avg Glucose: 108
Estimated Avg Glucose: 115
Hemoglobin A1C: 5.4 % (ref 4.0–6.0)

## 2022-10-04 LAB — LIPID PANEL
Chol/HDL Ratio: 5.4 — ABNORMAL HIGH (ref 0.0–4.4)
Cholesterol, Total: 211 mg/dL — ABNORMAL HIGH (ref 100–200)
HDL: 39 mg/dL — ABNORMAL LOW (ref >=40)
LDL Cholesterol: 149.2 mg/dL — ABNORMAL HIGH (ref 0.0–100.0)
LDL/HDL Ratio: 3.8
Triglycerides: 114 mg/dL (ref 0–149)
VLDL: 22.8 mg/dL (ref 5.0–40.0)

## 2022-10-07 ENCOUNTER — Ambulatory Visit
Admit: 2022-10-07 | Discharge: 2022-10-07 | Payer: BLUE CROSS/BLUE SHIELD | Attending: Student in an Organized Health Care Education/Training Program | Primary: Family Medicine

## 2022-10-07 VITALS — BP 162/88 | HR 88 | Ht 77.0 in | Wt 252.1 lb

## 2022-10-07 DIAGNOSIS — Z0000 Encounter for general adult medical examination without abnormal findings: Secondary | ICD-10-CM

## 2022-10-07 MED ORDER — OLMESARTAN MEDOXOMIL 20 MG PO TABS
20 | ORAL_TABLET | Freq: Every day | ORAL | 1 refills | Status: DC
Start: 2022-10-07 — End: 2022-11-04

## 2022-10-07 NOTE — Progress Notes (Signed)
 10/07/2022    Don Love (DOB:  Apr 17, 1975) is a 47 y.o. male, here for a preventive medicine evaluation.    Has had a few episodes of blurred vision - couldn't focus on anything. Tension type headache and Tylenol, Excedrin is not helping.     Smoking again. Mom passed away 2 mo ago and has been a little more stressed recently. Has also been eating out more than usual - [employer]. Has a very active job walking 25k-30k steps in a day.    Patient Active Problem List   Diagnosis    Hypertension    TIA (transient ischemic attack)    Primary insomnia    Corn of foot       Review of Systems   Constitutional:  Negative for chills and fever.   Respiratory:  Negative for shortness of breath.    Cardiovascular:  Negative for chest pain, palpitations and leg swelling.   Gastrointestinal:  Negative for abdominal pain, constipation, diarrhea and nausea.   Genitourinary:  Negative for difficulty urinating.   All other systems reviewed and are negative.      Prior to Visit Medications    Medication Sig Taking? Authorizing Provider   aspirin 81 MG chewable tablet Take 1 tablet by mouth daily Yes [provider]   olmesartan  (BENICAR ) 20 MG tablet Take 1 tablet by mouth daily Yes Attikus Bartoszek Marie, [place]        Allergies   Allergen Reactions    Codeine Rash       Past Medical History:   Diagnosis Date    Hypertension        Past Surgical History:   Procedure Laterality Date    ANTERIOR CERVICAL DISCECTOMY W/ FUSION      APPENDECTOMY      KNEE ARTHROPLASTY      ROTATOR CUFF REPAIR      SHOULDER ARTHROSCOPY W/ LABRAL REPAIR      SPINAL FUSION      L4/5    VASECTOMY         Social History     Socioeconomic History    Marital status: Married     Spouse name: Not on file    Number of children: Not on file    Years of education: Not on file    Highest education level: Not on file   Occupational History    Not on file   Tobacco Use    Smoking status: Former     Current packs/day: 0.00     Average packs/day: 0.5  packs/day for 15.0 years (7.5 ttl pk-yrs)     Types: Cigarettes     Start date: 04/24/2005     Quit date: 04/24/2020     Years since quitting: 2.4     Passive exposure: Never    Smokeless tobacco: Never   Vaping Use    Vaping status: Never Used   Substance and Sexual Activity    Alcohol use: Yes     Comment: socially    Drug use: Never    Sexual activity: Not on file   Other Topics Concern    Not on file   Social History Narrative    Not on file     Social Determinants of Health     Financial Resource Strain: Not on file   Food Insecurity: Not on file   Transportation Needs: Not on file   Physical Activity: Not on file   Stress: Not on file   Social Connections:  Not on file   Intimate Partner Violence: Not on file   Housing Stability: Not on file        Family History   Problem Relation Age of Onset    Cancer Mother         Breast    Dementia Mother     Cancer Father         Prostate         Vitals:    10/07/22 0850   BP: (!) 162/88   Pulse: 88   SpO2: 96%   Weight: 114.4 kg (252 lb 1.6 oz)   Height: 1.956 m (6' 5)     Estimated body mass index is 29.89 kg/m as calculated from the following:    Height as of this encounter: 1.956 m (6' 5).    Weight as of this encounter: 114.4 kg (252 lb 1.6 oz).    Physical Exam  Vitals and nursing note reviewed.   Constitutional:       General: He is not in acute distress.     Appearance: He is not toxic-appearing.   HENT:      Head: Normocephalic and atraumatic.      Nose: Nose normal.   Eyes:      Extraocular Movements: Extraocular movements intact.      Pupils: Pupils are equal, round, and reactive to light.   Cardiovascular:      Rate and Rhythm: Normal rate and regular rhythm.      Heart sounds: No murmur heard.     No friction rub. No gallop.   Pulmonary:      Effort: Pulmonary effort is normal.      Breath sounds: No wheezing, rhonchi or rales.   Abdominal:      General: Abdomen is flat.      Palpations: Abdomen is soft.   Skin:     General: Skin is warm and dry.    Neurological:      General: No focal deficit present.      Mental Status: He is alert and oriented to person, place, and time.   Psychiatric:         Mood and Affect: Mood normal.         Thought Content: Thought content normal.              Latest Ref Rng & Units 10/04/2022     8:24 AM   [facility]   A1C 4.0 - 6.0 % 5.4    A1C POC 4.0 - 6.0 % 5.4    GLU random 70 - 99 mg/dL 88    CHOL 899 - 799 mg/dL 788    TRIG 0 - 850 mg/dL 885    HDL >=59 mg/dL 39    LDL CALC 0.0 - 899.9 mg/dL 850.7    NA 864 - 854 mmol/L 139    K 3.5 - 5.3 mmol/L 4.5    BUN 6 - 20 mg/dL 11    CR 0.7 - 1.3 mg/dL 0.9    GFR >=39 fO/fpw/8.26f 105    CA 8.5 - 10.7 mg/dL 9.5    ALT 0 - 50 unit/L 24    AST 0 - 50 unit/L 24        Lab Results   Component Value Date/Time    CHOL 211 10/04/2022 08:24 AM    TRIG 114 10/04/2022 08:24 AM    HDL 39 10/04/2022 08:24 AM    GLUCOSE 88 10/04/2022 08:24 AM  LABA1C 5.4 10/04/2022 08:24 AM       The 10-year ASCVD risk score (Arnett DK, et al., 2019) is: 7.1%    Values used to calculate the score:      Age: 58 years      Sex: Male      Is Non-Hispanic African American: No      Diabetic: No      Tobacco smoker: No      Systolic Blood Pressure: 162 mmHg      Is BP treated: Yes      HDL Cholesterol: 39 mg/dL      Total Cholesterol: 211 mg/dL      There is no immunization history on file for this patient.    Health Maintenance   Topic Date Due    HIV screen  Never done    Hepatitis C screen  Never done    Hepatitis B vaccine (1 of 3 - 19+ 3-dose series) Never done    DTaP/Tdap/Td vaccine (1 - Tdap) Never done    Flu vaccine (1) Never done    COVID-19 Vaccine (1 - 2023-24 season) Never done    Depression Screen  10/14/2022    Colorectal Cancer Screen  01/24/2025    Lipids  10/04/2027    Hepatitis A vaccine  Aged Out    Hib vaccine  Aged Out    Polio vaccine  Aged Out    Meningococcal (ACWY) vaccine  Aged Out    Pneumococcal 0-64 years Vaccine  Aged Out    Diabetes screen  Discontinued       Assessment & Plan    Encounter for general adult medical examination without abnormal findings  Primary hypertension  Patient's blood pressure is quite elevated today at 162/88.  He is amenable to starting blood pressure medication.  Discussed that this could be attributing to some of the symptoms that he is having including headache and blurred vision.  We also discussed diet changes that could be made such as limiting eating out and salt intake as well as quitting smoking again.  Discussed following up with us  in 4 weeks to see if his headaches have improved, if not may have to pursue further headache workup as these are new onset headaches not relieved by Tylenol/Excedrin along with blurred vision.  -     olmesartan  (BENICAR ) 20 MG tablet; Take 1 tablet by mouth daily, Disp-30 tablet, R-1Normal  Mixed hyperlipidemia  The 10-year ASCVD risk score (Arnett DK, et al., 2019) is: 7.1%.  Borderline.  Patient would like to try to quit smoking as well as make some lifestyle changes before starting statin.  He had a recent loss of his mother, which has contributed to some increased stress and has led him to go back to smoking as well as eat out more frequently.Will give copy of low fat diet today.  Corn of foot  Stable is going try OTC options - if these don't work discussed contacting us  and we can place consult for podiatry  Blurred vision  Acute non intractable tension-type headache  With blurred vision and headache, discussed that these could be symptoms of hypertension.  Would like to try lowering blood pressure before pursuing full workup.  Will have him come for close follow-up in 4 weeks, if the symptoms are not improving may have to consider possible imaging.  We discussed restarting his aspirin daily as he does have a history of TIA, with headaches and vision changes I would like to be on  the safe side and have him restart this.      Return in about 4 weeks (around 11/04/2022) for HTN.         --Tzvi Economou Marie Izekiel Flegel, PA

## 2022-10-07 NOTE — Other (Signed)
Discussed during visit

## 2022-10-29 ENCOUNTER — Encounter

## 2022-11-04 ENCOUNTER — Encounter: Payer: BLUE CROSS/BLUE SHIELD | Attending: Family Medicine | Primary: Family Medicine

## 2022-11-04 NOTE — Assessment & Plan Note (Signed)
Elevated total and LDL cholesterol.  Patient has started working on dietary modifications.  Will recheck in 6 months and calculate prevent AHA 10 and 30-year cardiovascular disease risk scores

## 2022-11-04 NOTE — Assessment & Plan Note (Signed)
Blood pressure 128/86 in clinic today.  Doing well on olmesartan 20 mg without any adverse side effects.  Review of labs showed normal GFR and creatinine.  Will recheck in 6 months.  Olmesartan refilled today

## 2022-11-04 NOTE — Progress Notes (Signed)
Don Love (DOB:  [birth year]) is a 47 y.o. male here for evaluation of the following chief complaint(s):  Follow-up    SUBJECTIVE:  HPI  47 year old male seen in clinic for follow-up.  Was started on blood pressure medication about 1 month ago and has been doing well without any adverse side effects.    Review of Systems  See HPI for details    Past Medical History:   Diagnosis Date    Hypertension       Family History   Problem Relation Age of Onset    Cancer Mother         Breast    Dementia Mother     Cancer Father         Prostate    Unknown Sister     Unknown Brother     Stroke Maternal Grandmother     Dementia Maternal Grandmother     Alzheimer's Disease Maternal Grandmother     Alzheimer's Disease Maternal Grandfather     Unknown Paternal Grandmother     Unknown Paternal Grandfather     No Known Problems Daughter       Social History     Socioeconomic History    Marital status: Married     Spouse name: Not on file    Number of children: Not on file    Years of education: Not on file    Highest education level: Not on file   Occupational History    Not on file   Tobacco Use    Smoking status: Former     Current packs/day: 0.00     Average packs/day: 0.5 packs/day for 15.0 years (7.5 ttl pk-yrs)     Types: Cigarettes     Start date: 04/24/2005     Quit date: 04/24/2020     Years since quitting: 2.5     Passive exposure: Never    Smokeless tobacco: Never   Vaping Use    Vaping status: Never Used   Substance and Sexual Activity    Alcohol use: Yes     Comment: socially    Drug use: Never    Sexual activity: Not on file   Other Topics Concern    Not on file   Social History Narrative    Not on file     Social Determinants of Health     Financial Resource Strain: Not on file   Food Insecurity: Not on file   Transportation Needs: Not on file   Physical Activity: Not on file   Stress: Not on file   Social Connections: Not on file   Intimate Partner Violence: Not on file   Housing Stability: Not on file      Past  Surgical History:   Procedure Laterality Date    ANTERIOR CERVICAL DISCECTOMY W/ FUSION      APPENDECTOMY      KNEE ARTHROPLASTY      ROTATOR CUFF REPAIR      SHOULDER ARTHROSCOPY W/ LABRAL REPAIR      SPINAL FUSION      L4/5    VASECTOMY          OBJECTIVE:  Physical Exam  BP 128/86   Pulse 93   Ht 1.956 m (6\' 5" )   Wt 115.4 kg (254 lb 6.4 oz)   SpO2 98%   BMI 30.17 kg/m   General Appearance:  Well appearing, developed and nourished.  Appears stated age.  No acute distress.  Head:  Normocephalic,  atraumatic.  Ears:   External ears normal  Nose:  Nares patent, no lesions.  Heart:  Regular rate and regular rhythm.  No murmurs.  No rubs or gallops.  Lungs:  Clear to auscultation bilaterally with good air movement.  No focal adventitial sounds.  Skin:  Temperature normal. No rashes noted.  Musculoskeletal:  Grossly full range of motion, no swelling or deformity.  Neurologic:  Grossly non focal.  Psych:  Cognitive function intact.  Judgement and insight normal.  Mood and affect appropriate.    No results found for any visits on 11/04/22.  ASSESSMENT/PLAN:  1. Primary hypertension  Assessment & Plan:  Blood pressure 128/86 in clinic today.  Doing well on olmesartan 20 mg without any adverse side effects.  Review of labs showed normal GFR and creatinine.  Will recheck in 6 months.  Olmesartan refilled today   Orders:  -     olmesartan (BENICAR) 20 MG tablet; Take 1 tablet by mouth daily, Disp-90 tablet, R-3Normal  -     Cbc With Auto Differential; Future  2. Hyperlipidemia, unspecified hyperlipidemia type  Assessment & Plan:  Elevated total and LDL cholesterol.  Patient has started working on dietary modifications.  Will recheck in 6 months and calculate prevent AHA 10 and 30-year cardiovascular disease risk scores   Orders:  -     Lipid Panel; Future  -     Comprehensive Metabolic Panel; Future      Return in about 6 months (around 05/05/2023).    An electronic signature was used to authenticate this note.  Wynonia Lawman, DO

## 2023-05-05 ENCOUNTER — Encounter: Payer: BLUE CROSS/BLUE SHIELD | Attending: Family Medicine | Primary: Family Medicine

## 2023-06-05 ENCOUNTER — Ambulatory Visit
Admit: 2023-06-05 | Discharge: 2023-06-05 | Payer: PRIVATE HEALTH INSURANCE | Attending: Emergency Medicine | Primary: Family Medicine

## 2023-06-05 ENCOUNTER — Ambulatory Visit: Admit: 2023-06-05 | Discharge: 2023-06-05 | Payer: PRIVATE HEALTH INSURANCE | Primary: Family Medicine

## 2023-06-05 VITALS — BP 120/74 | HR 72 | Temp 98.30000°F | Resp 16 | Ht 77.0 in | Wt 235.0 lb

## 2023-06-05 DIAGNOSIS — S8392XA Sprain of unspecified site of left knee, initial encounter: Secondary | ICD-10-CM

## 2023-06-05 DIAGNOSIS — M25562 Pain in left knee: Secondary | ICD-10-CM

## 2023-06-05 MED ORDER — DEXAMETHASONE SODIUM PHOSPHATE 10 MG/ML IJ SOLN
10 | Freq: Once | INTRAMUSCULAR | Status: AC
Start: 2023-06-05 — End: 2023-06-05
  Administered 2023-06-05: 22:00:00 10 mg via INTRAMUSCULAR

## 2023-06-05 MED ORDER — IBUPROFEN 800 MG PO TABS
800 | ORAL_TABLET | Freq: Three times a day (TID) | ORAL | 0 refills | 12.00000 days | Status: AC
Start: 2023-06-05 — End: 2023-06-12

## 2023-06-05 NOTE — Patient Instructions (Signed)
 Ice, elevate, limit walking  Ibuprofen three times a day with food    Follow up with orthopedics

## 2023-06-05 NOTE — Progress Notes (Signed)
 Thompson Gato (DOB:  28-Nov-1975) is a 48 y.o. male,Established patient, here for evaluation of the following chief complaint(s):  Knee Pain (Left knee pain. Denies injury.  Swelling left lateral knee radiating pain down to the ankle)      Assessment & Plan   ASSESSMENT/PLAN:  1. Sprain of left knee, unspecified ligament, initial encounter  2. Acute pain of left knee  -     XR KNEE LEFT (3 VIEWS); Future  -     ibuprofen (ADVIL;MOTRIN) 800 MG tablet; Take 1 tablet by mouth 3 times daily (with meals) for 7 days, Disp-21 tablet, R-0Normal  -     dexAMETHasone (DECADRON) injection 10 mg; 10 mg, IntraMUSCular, ONCE, 1 dose, On Mon 06/05/23 at 1800      No follow-ups on file.  Follow up with your primary doctor as needed, go to the ER if symptoms worsen      X-ray by my read shows small effusion and some mild medial degenerative changes.  I told the patient he may have just irritated the knee with his activity.  He declines crutches but we will treat with elevation and ice at the end of the day along with regular ibuprofen.  IM dexamethasone x 1 here and then orthopedic appointment in about a week.    Subjective   SUBJECTIVE/OBJECTIVE:  48 year old male complains of left knee pain.  He denies any injury but states he has been moving on the weekends.  He does not do heavy lifting but a lot of of walking and mild lifting.  He complains of medial side pain with some swelling.  He is able to bear weight but with discomfort.    Review of Systems   Constitutional:  Negative for fever.   Musculoskeletal:  Positive for arthralgias and joint swelling.   All other systems reviewed and are negative.           Objective   BP 120/74   Pulse 72   Temp 98.3 F (36.8 C) (Oral)   Resp 16   Ht 1.956 m (6\' 5" )   Wt 106.6 kg (235 lb)   SpO2 97%   BMI 27.87 kg/m   Physical Exam  Vitals and nursing note reviewed.   Constitutional:       Appearance: Normal appearance.   Musculoskeletal:      Comments: Left knee has pain on range  of motion.  There is mild swelling.  There is medial joint line tenderness.  It appears stable.  Calf is soft nontender.  No redness or warmth.  No open wound.   Neurological:      Mental Status: He is alert.                  An electronic signature was used to authenticate this note.    --Cassandra Hindman, MD

## 2023-06-09 ENCOUNTER — Ambulatory Visit: Admit: 2023-06-09 | Discharge: 2023-06-09 | Payer: PRIVATE HEALTH INSURANCE | Primary: Family Medicine

## 2023-06-09 ENCOUNTER — Ambulatory Visit
Admit: 2023-06-09 | Discharge: 2023-06-09 | Payer: PRIVATE HEALTH INSURANCE | Attending: Family Medicine | Primary: Family Medicine

## 2023-06-09 VITALS — BP 138/88 | HR 78 | Temp 98.50000°F | Resp 18 | Ht 77.0 in | Wt 235.0 lb

## 2023-06-09 DIAGNOSIS — M79605 Pain in left leg: Secondary | ICD-10-CM

## 2023-06-09 NOTE — Progress Notes (Addendum)
 Subjective:leg pain      Patient ID: Don Love       Knee Pain      The patient is a 48 y.o. 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 No fever  no knee pain now no swelling    Review of Systems: As noted in the HPI    Past Medical History:   Diagnosis Date    Hypertension         Past Surgical History:   Procedure Laterality Date    ANTERIOR CERVICAL DISCECTOMY W/ FUSION      APPENDECTOMY      KNEE ARTHROPLASTY      ROTATOR CUFF REPAIR      SHOULDER ARTHROSCOPY W/ LABRAL REPAIR      SPINAL FUSION      L4/5    VASECTOMY          Allergies   Allergen Reactions    Codeine Rash        Current Outpatient Medications   Medication Sig Dispense Refill    ibuprofen  (ADVIL ;MOTRIN ) 800 MG tablet Take 1 tablet by mouth 3 times daily (with meals) for 7 days 21 tablet 0    olmesartan  (BENICAR ) 20 MG tablet Take 1 tablet by mouth daily 90 tablet 3    aspirin 81 MG chewable tablet Take 1 tablet by mouth daily       No current facility-administered medications for this visit.        BP 138/88   Pulse 78   Temp 98.5 F (36.9 C)   Resp 18   Ht 1.956 m (6\' 5" )   Wt 106.6 kg (235 lb)   SpO2 97%   BMI 27.87 kg/m       Objective:   Physical Exam  Vitals reviewed.   Constitutional:       General: He is not in acute distress.     Appearance: Normal appearance. He is normal weight. He is not ill-appearing, toxic-appearing or diaphoretic.   Musculoskeletal:      Left knee: Normal.      Left lower leg: Tenderness and bony tenderness present. No swelling, deformity or lacerations. No edema.      Left ankle: Normal.      Left foot: Normal.      Comments: Left post calf ttp + homans neg cords.   Neurological:      Mental Status: He is alert.     Small non infected skin abrasion mid tib fib no abscess    No scans are attached to the encounter.     Assessment:   1. Pain of left lower extremity  -     XR TIBIA FIBULA LEFT (2 VIEWS); Future       Plan:   No  results found for any visits on 06/09/23.   Xray no fx. Has severe leg pain unclear etiology concern for dvt. To nearest er for eval need for US          Scherrie Curt, MD

## 2023-06-15 ENCOUNTER — Encounter: Payer: PRIVATE HEALTH INSURANCE | Attending: Surgical | Primary: Family Medicine

## 2023-06-16 ENCOUNTER — Encounter: Payer: PRIVATE HEALTH INSURANCE | Attending: Surgical | Primary: Family Medicine

## 2023-11-17 ENCOUNTER — Encounter
